# Patient Record
Sex: Female | Born: 1980 | Race: White | Hispanic: No | Marital: Married | Smoking: Never smoker
Health system: Southern US, Community
[De-identification: ages and names within clinical notes are randomized; demographics above are authoritative.]

## PROBLEM LIST (undated history)

## (undated) ENCOUNTER — Emergency Department (HOSPITAL_BASED_OUTPATIENT_CLINIC_OR_DEPARTMENT_OTHER): Disposition: A | Discharge status: Home / Self Care | Payer: PRIVATE HEALTH INSURANCE

## (undated) DIAGNOSIS — K219 Gastro-esophageal reflux disease without esophagitis: Secondary | ICD-10-CM

## (undated) DIAGNOSIS — E282 Polycystic ovarian syndrome: Secondary | ICD-10-CM

## (undated) DIAGNOSIS — F329 Major depressive disorder, single episode, unspecified: Secondary | ICD-10-CM

## (undated) DIAGNOSIS — M1990 Unspecified osteoarthritis, unspecified site: Secondary | ICD-10-CM

## (undated) DIAGNOSIS — F32A Depression, unspecified: Secondary | ICD-10-CM

## (undated) DIAGNOSIS — M797 Fibromyalgia: Secondary | ICD-10-CM

## (undated) DIAGNOSIS — G43909 Migraine, unspecified, not intractable, without status migrainosus: Secondary | ICD-10-CM

## (undated) DIAGNOSIS — Z8619 Personal history of other infectious and parasitic diseases: Secondary | ICD-10-CM

## (undated) HISTORY — DX: Unspecified osteoarthritis, unspecified site: M19.90

## (undated) HISTORY — DX: Major depressive disorder, single episode, unspecified: F32.9

## (undated) HISTORY — DX: Personal history of other infectious and parasitic diseases: Z86.19

## (undated) HISTORY — DX: Depression, unspecified: F32.A

## (undated) HISTORY — DX: Gastro-esophageal reflux disease without esophagitis: K21.9

## (undated) HISTORY — DX: Migraine, unspecified, not intractable, without status migrainosus: G43.909

---

## 2007-09-11 ENCOUNTER — Emergency Department (HOSPITAL_COMMUNITY): Admission: EM | Admit: 2007-09-11 | Discharge: 2007-09-11 | Payer: Self-pay | Admitting: Emergency Medicine

## 2010-03-25 HISTORY — PX: TUBAL LIGATION: SHX77

## 2010-03-25 LAB — HM PAP SMEAR: HM Pap smear: NORMAL

## 2012-07-15 ENCOUNTER — Encounter: Payer: Self-pay | Admitting: Family

## 2012-07-15 ENCOUNTER — Ambulatory Visit (INDEPENDENT_AMBULATORY_CARE_PROVIDER_SITE_OTHER): Payer: BC Managed Care – PPO | Admitting: Family

## 2012-07-15 VITALS — BP 118/80 | HR 90 | Temp 98.3°F | Resp 16 | Ht 64.75 in | Wt 178.1 lb

## 2012-07-15 DIAGNOSIS — K219 Gastro-esophageal reflux disease without esophagitis: Secondary | ICD-10-CM

## 2012-07-15 DIAGNOSIS — L680 Hirsutism: Secondary | ICD-10-CM

## 2012-07-15 DIAGNOSIS — F329 Major depressive disorder, single episode, unspecified: Secondary | ICD-10-CM

## 2012-07-15 DIAGNOSIS — N926 Irregular menstruation, unspecified: Secondary | ICD-10-CM

## 2012-07-15 DIAGNOSIS — G43909 Migraine, unspecified, not intractable, without status migrainosus: Secondary | ICD-10-CM

## 2012-07-15 DIAGNOSIS — M069 Rheumatoid arthritis, unspecified: Secondary | ICD-10-CM

## 2012-07-15 LAB — LUTEINIZING HORMONE: LH: 4.3 m[IU]/mL

## 2012-07-15 LAB — ESTRADIOL: Estradiol: 13.6 pg/mL

## 2012-07-15 LAB — FOLLICLE STIMULATING HORMONE: FSH: 2.2 m[IU]/mL

## 2012-07-15 NOTE — Patient Instructions (Addendum)
Please complete your lab work prior to leaving.  You will be contacted about your referral to rheumatology.  Please let us know if you have not heard back within 1 week about your referral. Follow up in 1 month for a complete fasting physical. Welcome to [facility]!

## 2012-07-15 NOTE — Progress Notes (Signed)
  Subjective:    Patient ID: Rayetta Humphrey, female    DOB: 1980-05-11, 32 y.o.   MRN: [id number]  HPI  Ms. Deprey is a 32 yr old female who presents today to establish care.  1) RA- Recently diagnosed with autoimmune rheumatoid arthritis (2011).  Has bilateral hip and knee pain. Previously diagnosed with fibromyalgia. She reports muscle soreness.  She reports that her knees constantly ache and crunch.    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 Reports that for the last 2 years she has had to shave daily.   3) GERD- she uses otc prilosec daily.  4) Depression- Diagnosed as a child.  7-17.  Spent 10 months in an inpatient facility.  She reports that since that time, she has done well except for post partum and she was treated x 6 months after each of her 3 pregnancies. Reports currently well controlled.   5) Frequent headaches- Reports 2-3 headaches a week, uses otc excedrin.  Some improvement. She reports that hte pain is over the right eye, radiates down into neck at times, throbbing. She reports + associated photophobia, but denies phonophobia.    Review of Systems  Constitutional: Negative for unexpected weight change.  HENT: Negative for hearing loss and congestion.   Eyes: Negative for visual disturbance.  Respiratory: Negative for cough and shortness of breath.   Cardiovascular: Negative for chest pain.  Gastrointestinal: Negative for nausea, vomiting and diarrhea.  Genitourinary: Negative for dysuria.       Irregular menses  Musculoskeletal: Positive for myalgias and arthralgias.  Skin: Positive for rash.       Reports psoriasis  Neurological: Positive for headaches.  Hematological: Negative for adenopathy.  Psychiatric/Behavioral:       Denies anxiety       Objective:   Physical Exam  Constitutional: She is oriented to person, place, and time. She appears well-developed and well-nourished. No distress.  HENT:  Head: Normocephalic and atraumatic.  Eyes: No  scleral icterus.  Neck: No thyromegaly present.  Cardiovascular: Normal rate and regular rhythm.   No murmur heard. Pulmonary/Chest: Effort normal and breath sounds normal. No respiratory distress. She has no wheezes. She has no rales. She exhibits no tenderness.  Lymphadenopathy:    She has no cervical adenopathy.  Neurological: She is alert and oriented to person, place, and time.  Skin: Skin is warm and dry.  Heavy facial hair noted on face and neck.  Psychiatric: She has a normal mood and affect. Her behavior is normal. Judgment and thought content normal.          Assessment & Plan:

## 2012-07-17 ENCOUNTER — Telehealth: Payer: Self-pay | Admitting: Family

## 2012-07-17 DIAGNOSIS — L680 Hirsutism: Secondary | ICD-10-CM

## 2012-07-17 NOTE — Telephone Encounter (Signed)
See mychart message. Will refer to endo for low estrogen increase T.

## 2012-07-18 ENCOUNTER — Telehealth: Payer: Self-pay | Admitting: Family

## 2012-07-18 DIAGNOSIS — F329 Major depressive disorder, single episode, unspecified: Secondary | ICD-10-CM | POA: Insufficient documentation

## 2012-07-18 DIAGNOSIS — L680 Hirsutism: Secondary | ICD-10-CM | POA: Insufficient documentation

## 2012-07-18 DIAGNOSIS — G43909 Migraine, unspecified, not intractable, without status migrainosus: Secondary | ICD-10-CM | POA: Insufficient documentation

## 2012-07-18 DIAGNOSIS — F32A Depression, unspecified: Secondary | ICD-10-CM | POA: Insufficient documentation

## 2012-07-18 DIAGNOSIS — M069 Rheumatoid arthritis, unspecified: Secondary | ICD-10-CM | POA: Insufficient documentation

## 2012-07-18 DIAGNOSIS — K219 Gastro-esophageal reflux disease without esophagitis: Secondary | ICD-10-CM | POA: Insufficient documentation

## 2012-07-18 MED ORDER — AMITRIPTYLINE HCL 25 MG PO TABS: 25.0000 mg | ORAL_TABLET | Freq: Every day | ORAL | Status: DC

## 2012-07-18 NOTE — Assessment & Plan Note (Signed)
Will give trial of Elavil for HA prophylaxis.

## 2012-07-18 NOTE — Assessment & Plan Note (Signed)
Stable on otc prilosec. Continue same.

## 2012-07-18 NOTE — Assessment & Plan Note (Signed)
She has never seen a rheumatologist for this. Will request old records. Refer to rheumatology for further evaluation.

## 2012-07-18 NOTE — Telephone Encounter (Signed)
Pls call pt and let her know that I have sent rx to [facility] for Elavil to take once daily to help prevent her headaches.

## 2012-07-18 NOTE — Assessment & Plan Note (Addendum)
Currently well controlled off of meds.  Monitor.

## 2012-07-18 NOTE — Assessment & Plan Note (Signed)
Lab work reveals elevated testosterone level and depressed estrogen level.  Will refer to Endo for further evaluation.

## 2012-07-21 NOTE — Telephone Encounter (Signed)
Notified pt. 

## 2012-08-05 ENCOUNTER — Encounter: Payer: BC Managed Care – PPO | Admitting: Family

## 2012-08-07 ENCOUNTER — Ambulatory Visit: Payer: BC Managed Care – PPO | Admitting: Internal Medicine

## 2012-08-27 ENCOUNTER — Ambulatory Visit: Payer: BC Managed Care – PPO | Admitting: Internal Medicine

## 2012-08-27 DIAGNOSIS — Z0289 Encounter for other administrative examinations: Secondary | ICD-10-CM

## 2012-09-01 ENCOUNTER — Ambulatory Visit (INDEPENDENT_AMBULATORY_CARE_PROVIDER_SITE_OTHER): Payer: BC Managed Care – PPO | Admitting: Family

## 2012-09-01 ENCOUNTER — Encounter: Payer: Self-pay | Admitting: Family

## 2012-09-01 VITALS — BP 116/60 | HR 78 | Temp 97.8°F | Resp 16 | Ht 64.75 in | Wt 178.0 lb

## 2012-09-01 DIAGNOSIS — Z0000 Encounter for general adult medical examination without abnormal findings: Secondary | ICD-10-CM

## 2012-09-01 LAB — URINALYSIS, ROUTINE W REFLEX MICROSCOPIC
Glucose, UA: NEGATIVE mg/dL
Hgb urine dipstick: NEGATIVE
Ketones, ur: NEGATIVE mg/dL
Leukocytes, UA: NEGATIVE
pH: 5.5 (ref 5.0–8.0)

## 2012-09-01 LAB — LIPID PANEL: Cholesterol: 184 mg/dL (ref 0–200)

## 2012-09-01 LAB — HEPATIC FUNCTION PANEL
AST: 14 U/L (ref 0–37)
Alkaline Phosphatase: 82 U/L (ref 39–117)
Bilirubin, Direct: 0.1 mg/dL (ref 0.0–0.3)
Total Bilirubin: 0.3 mg/dL (ref 0.3–1.2)

## 2012-09-01 LAB — CBC WITH DIFFERENTIAL/PLATELET
Basophils Relative: 0 % (ref 0–1)
Eosinophils Absolute: 0 10*3/uL (ref 0.0–0.7)
MCH: 30.3 pg (ref 26.0–34.0)
MCHC: 33.8 g/dL (ref 30.0–36.0)
Neutrophils Relative %: 61 % (ref 43–77)
Platelets: 319 10*3/uL (ref 150–400)
RBC: 4.19 MIL/uL (ref 3.87–5.11)

## 2012-09-01 NOTE — Patient Instructions (Addendum)
Please complete lab work prior to leaving. Schedule pap smear at your earliest convenience.

## 2012-09-01 NOTE — Assessment & Plan Note (Signed)
Discussed, importance of healthy diet, exercise. Obtained fasting labs.  She will return another day for pap.

## 2012-09-01 NOTE — Progress Notes (Signed)
Subjective:    Patient ID: Brandi Parrish, female    DOB: 09-Apr-1980, 32 y.o.   MRN: [id number]  HPI  Brandi Parrish is a 32 yr old female who presents today for cpx.  Pt here for fasting physical.  Up to date with tetanus, last pap smear 2012.  Patient presents today for complete physical.  Immunizations: tetanus up to date Diet: could eat less fried foods.  Exercise: walking and swimming Pap Smear: due- she wishes to defer  Hirsuitism- last visit estrogen was depressed and testosterone elevated.  Referral made to endo. She has rescheduled her appointment with endo.  RA- has upcoming apt with rheumatology.  Migraines- Last visit trial of amitriptyline was attempted. Reports that her headaches have improved. Only one Migraine since starting.     Review of Systems  Constitutional: Negative for unexpected weight change.  HENT: Negative for congestion.   Eyes: Negative for visual disturbance.  Respiratory: Negative for cough.   Cardiovascular: Negative for chest pain.  Gastrointestinal:       Mild nausea about 2 days a week in early AM with diarrhea.  This has been going on x 1 month.   Genitourinary:       Irregular menses  Musculoskeletal:       Chronic knee/hip pain  Skin: Negative for rash.  Neurological: Positive for headaches.  Hematological: Negative for adenopathy.  Psychiatric/Behavioral:       Reports that depression is stable.    Past Medical History  Diagnosis Date  . Arthritis     autoimmune rheumatoid arthritis  . History of chicken pox   . Depression   . GERD (gastroesophageal reflux disease)     History   Social History  . Marital Status: Married    Spouse Name: N/A    Number of Children: 3  . Years of Education: N/A   Occupational History  .     Social History Main Topics  . Smoking status: Never Smoker   . Smokeless tobacco: Never Used  . Alcohol Use: Yes     Comment: 1-2 drinks per month  . Drug Use: Not on file  . Sexually Active: Not  on file   Other Topics Concern  . 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 Cesarean section  2009  . Cesarean section  2012  . Tubal ligation  2012    Family History  Problem Relation Age of Onset  . Arthritis Mother     spondylitis  . Heart disease Father   . Diabetes Maternal Uncle   . Arthritis Maternal Grandmother   . Arthritis Maternal Grandfather   . Arthritis Paternal Grandmother     RA  . Arthritis Paternal Grandfather   . Hyperlipidemia Paternal Grandfather   . Hypertension Paternal Grandfather   . Stroke Paternal Grandfather   . Kidney disease Paternal Grandfather   . Heart disease Paternal Grandfather     No Known Allergies  Current Outpatient Prescriptions on File Prior to Visit  Medication Sig Dispense Refill  . amitriptyline (ELAVIL) 25 MG tablet Take 1 tablet (25 mg total) by mouth at bedtime.  30 tablet  0  . omeprazole (PRILOSEC) 20 MG capsule Take 40 mg by mouth daily.  No current facility-administered medications on file prior to visit.    BP 116/60  Pulse 78  Temp(Src) 97.8 F (36.6 C) (Oral)  Resp 16  Ht 5' 4.75" (1.645 m)  Wt 178 lb (80.74 kg)  BMI 29.84 kg/m2  SpO2 99%  LMP 08/12/2012       Objective:   Physical Exam  Physical Exam  Constitutional: She is oriented to person, place, and time. She appears well-developed and well-nourished. No distress.  HENT:  Head: Normocephalic and atraumatic.  Right Ear: Tympanic membrane and ear canal normal.  Left Ear: Tympanic membrane and ear canal normal.  Mouth/Throat: Oropharynx is clear and moist.  Eyes: Pupils are equal, round, and reactive to light. No scleral icterus.  Neck: Normal range of motion. No thyromegaly present.  Cardiovascular: Normal rate and regular rhythm.   No  murmur heard. Pulmonary/Chest: Effort normal and breath sounds normal. No respiratory distress. He has no wheezes. She has no rales. She exhibits no tenderness.  Abdominal: Soft. Bowel sounds are normal. He exhibits no distension and no mass. There is no tenderness. There is no rebound and no guarding.  Musculoskeletal: She exhibits no edema.  Lymphadenopathy:    She has no cervical adenopathy.  Neurological: She is alert and oriented to person, place, and time. She exhibits normal muscle tone. Coordination normal.  Skin: Skin is warm and dry. Hirsuitism Psychiatric: She has a normal mood and affect. Her behavior is normal. Judgment and thought content normal.    Assessment & Plan:         Assessment & Plan:

## 2012-09-02 ENCOUNTER — Encounter: Payer: Self-pay | Admitting: Family

## 2012-09-02 LAB — BASIC METABOLIC PANEL WITH GFR
BUN: 8 mg/dL (ref 6–23)
Chloride: 104 mEq/L (ref 96–112)
GFR, Est African American: >89 mL/min
GFR, Est Non African American: >89 mL/min
Potassium: 4.8 mEq/L (ref 3.5–5.3)

## 2012-09-07 ENCOUNTER — Encounter (HOSPITAL_BASED_OUTPATIENT_CLINIC_OR_DEPARTMENT_OTHER): Payer: Self-pay | Admitting: *Deleted

## 2012-09-07 ENCOUNTER — Emergency Department (HOSPITAL_BASED_OUTPATIENT_CLINIC_OR_DEPARTMENT_OTHER)
Admission: EM | Admit: 2012-09-07 | Discharge: 2012-09-07 | Disposition: A | Discharge status: Home / Self Care | Payer: BC Managed Care – PPO | Attending: Emergency Medicine | Admitting: Emergency Medicine

## 2012-09-07 DIAGNOSIS — Z79899 Other long term (current) drug therapy: Secondary | ICD-10-CM | POA: Insufficient documentation

## 2012-09-07 DIAGNOSIS — F329 Major depressive disorder, single episode, unspecified: Secondary | ICD-10-CM | POA: Insufficient documentation

## 2012-09-07 DIAGNOSIS — K219 Gastro-esophageal reflux disease without esophagitis: Secondary | ICD-10-CM | POA: Insufficient documentation

## 2012-09-07 DIAGNOSIS — M25569 Pain in unspecified knee: Secondary | ICD-10-CM | POA: Insufficient documentation

## 2012-09-07 DIAGNOSIS — M549 Dorsalgia, unspecified: Secondary | ICD-10-CM

## 2012-09-07 DIAGNOSIS — F3289 Other specified depressive episodes: Secondary | ICD-10-CM | POA: Insufficient documentation

## 2012-09-07 DIAGNOSIS — M545 Low back pain, unspecified: Secondary | ICD-10-CM | POA: Insufficient documentation

## 2012-09-07 DIAGNOSIS — Z8619 Personal history of other infectious and parasitic diseases: Secondary | ICD-10-CM | POA: Insufficient documentation

## 2012-09-07 DIAGNOSIS — M25559 Pain in unspecified hip: Secondary | ICD-10-CM | POA: Insufficient documentation

## 2012-09-07 DIAGNOSIS — Z8739 Personal history of other diseases of the musculoskeletal system and connective tissue: Secondary | ICD-10-CM | POA: Insufficient documentation

## 2012-09-07 DIAGNOSIS — Z3202 Encounter for pregnancy test, result negative: Secondary | ICD-10-CM | POA: Insufficient documentation

## 2012-09-07 LAB — URINALYSIS, ROUTINE W REFLEX MICROSCOPIC
Bilirubin Urine: NEGATIVE
Glucose, UA: NEGATIVE mg/dL
Hgb urine dipstick: NEGATIVE
Ketones, ur: NEGATIVE mg/dL
Leukocytes, UA: NEGATIVE
Nitrite: NEGATIVE
Protein, ur: NEGATIVE mg/dL
Specific Gravity, Urine: 1.025 (ref 1.005–1.030)
Urobilinogen, UA: 1 mg/dL (ref 0.0–1.0)
pH: 6 (ref 5.0–8.0)

## 2012-09-07 LAB — PREGNANCY, URINE: Preg Test, Ur: NEGATIVE

## 2012-09-07 MED ORDER — PREDNISONE 20 MG PO TABS: 40.0000 mg | ORAL_TABLET | Freq: Every day | ORAL | Status: DC

## 2012-09-07 MED ORDER — HYDROMORPHONE HCL PF 1 MG/ML IJ SOLN
1.0000 mg | Freq: Once | INTRAMUSCULAR | Status: AC
  Administered 2012-09-07: 1 mg via INTRAMUSCULAR
  Filled 2012-09-07: qty 1

## 2012-09-07 MED ORDER — DIPHENHYDRAMINE HCL 25 MG PO CAPS
25.0000 mg | ORAL_CAPSULE | Freq: Once | ORAL | Status: DC
  Filled 2012-09-07: qty 1

## 2012-09-07 MED ORDER — OXYCODONE-ACETAMINOPHEN 5-325 MG PO TABS: 1.0000 | ORAL_TABLET | ORAL | Status: DC | PRN

## 2012-09-07 NOTE — ED Provider Notes (Signed)
Medical screening examination/treatment/procedure(s) were performed by non-physician practitioner and as supervising physician I was immediately available for consultation/collaboration.    Adrie Picking, MD 09/07/12 2343 

## 2012-09-07 NOTE — ED Notes (Signed)
Called in to pt's room-c/o face and upper back itching-no hives/redness noted-no SOB noted-EDPA notified

## 2012-09-07 NOTE — ED Notes (Signed)
Pt c/o right lower back pain radiates down right hip and leg x 8 hrs denies injury

## 2012-09-07 NOTE — ED Provider Notes (Signed)
History     CSN: [id number]  Arrival date & time 09/07/12  1819   First MD Initiated Contact with Patient 09/07/12 1824      Chief Complaint  Patient presents with  . Back Pain    (Consider location/radiation/quality/duration/timing/severity/associated sxs/prior treatment) HPI Patient presents to the ED for back pain. States pain began approximately 8 hours and is now radiating down her right hip and posterior thigh.  No alleviating or aggravating factors.  Patient denies any recent injury, trauma, or fall. Patient states she routinely with her children, but this is not anything that she does not usually do on a daily basis. No excessive lifting or strenuous activity to cause muscle strain. Denies any numbness or paresthesias of lower extremities.  Patient has a history of RA and fibromyalgia.  Denies any urinary symptoms. Patient has not taken any medications for her symptoms.    Past Medical History  Diagnosis Date  . Arthritis     autoimmune rheumatoid arthritis  . History of chicken pox   . Depression   . GERD (gastroesophageal reflux disease)     Past Surgical History  Procedure Laterality Date  . Cesarean section  2009  . Cesarean section  2012  . Tubal ligation  2012    Family History  Problem Relation Age of Onset  . Arthritis Mother     spondylitis  . Heart disease Father   . Diabetes Maternal Uncle   . Arthritis Maternal Grandmother   . Arthritis Maternal Grandfather   . Arthritis Paternal Grandmother     RA  . Arthritis Paternal Grandfather   . Hyperlipidemia Paternal Grandfather   . Hypertension Paternal Grandfather   . Stroke Paternal Grandfather   . Kidney disease Paternal Grandfather   . Heart disease Paternal Grandfather     History  Substance Use Topics  . Smoking status: Never Smoker   . Smokeless tobacco: Never Used  . Alcohol Use: Yes     Comment: 1-2 drinks per month    OB History   Grav Para Term Preterm Abortions TAB SAB Ect Mult  Living                  Review of Systems  Musculoskeletal: Positive for back pain.  All other systems reviewed and are negative.    Allergies  Review of patient's allergies indicates no known allergies.  Home Medications   Current Outpatient Rx  Name  Route  Sig  Dispense  Refill  . amitriptyline (ELAVIL) 25 MG tablet   Oral   Take 1 tablet (25 mg total) by mouth at bedtime.   30 tablet   0   . omeprazole (PRILOSEC) 20 MG capsule   Oral   Take 40 mg by mouth daily.           BP 121/75  Pulse 110  Temp(Src) 98.5 F (36.9 C)  Ht 5\' 4"  (1.626 m)  Wt 178 lb (80.74 kg)  BMI 30.54 kg/m2  LMP 08/12/2012  Physical Exam  Nursing note and vitals reviewed. Constitutional: She is oriented to person, place, and time. She appears well-developed and well-nourished.  HENT:  Head: Normocephalic and atraumatic.  Eyes: Conjunctivae and EOM are normal.  Neck: Normal range of motion.  Cardiovascular: Normal rate, regular rhythm and normal heart sounds.   Pulmonary/Chest: Effort normal and breath sounds normal. No respiratory distress.  Musculoskeletal: Normal range of motion. She exhibits no edema.       Lumbar back: She exhibits tenderness  and pain. She exhibits normal range of motion, no bony tenderness, no swelling, no edema, no deformity, no laceration, no spasm and normal pulse.       Back:  TTP of right LS, no midline tenderness or spasm present, strong distal pulse, sensation intact  Neurological: She is alert and oriented to person, place, and time.  Skin: Skin is warm and dry.  Psychiatric: She has a normal mood and affect.    ED Course  Procedures (including critical care time)  Labs Reviewed  URINALYSIS, ROUTINE W REFLEX MICROSCOPIC  PREGNANCY, URINE   No results found.   1. Back pain       MDM   u-preg negative.  U/a free of infection. No recent injury or trauma- imaging deferred.  No loss of bowel/bladder function, saddle or extremity  numbness/paresthesias- doubt cauda equina, SCI, or other nerve damage.  Likely RA flare + fibromyalgia.  Rx percocet and steroid taper.  FU with PCP if sx not improving.  Discussed plan with pt, she agreed.      Garlon Hatchet, PA-C 09/07/12 2233

## 2012-09-07 NOTE — ED Notes (Signed)
In to give benadryl-pt now denies itching and need for benadryl

## 2013-01-28 ENCOUNTER — Other Ambulatory Visit: Payer: Self-pay

## 2013-03-01 ENCOUNTER — Ambulatory Visit: Payer: BC Managed Care – PPO | Admitting: Family

## 2013-03-08 ENCOUNTER — Encounter: Payer: Self-pay | Admitting: Family

## 2013-03-08 ENCOUNTER — Ambulatory Visit (HOSPITAL_BASED_OUTPATIENT_CLINIC_OR_DEPARTMENT_OTHER)
Admission: RE | Admit: 2013-03-08 | Discharge: 2013-03-08 | Disposition: A | Discharge status: Home / Self Care | Payer: PRIVATE HEALTH INSURANCE | Source: Ambulatory Visit | Attending: Family | Admitting: Family

## 2013-03-08 ENCOUNTER — Ambulatory Visit (INDEPENDENT_AMBULATORY_CARE_PROVIDER_SITE_OTHER): Payer: PRIVATE HEALTH INSURANCE | Admitting: Family

## 2013-03-08 VITALS — BP 122/88 | HR 70 | Temp 97.8°F | Resp 16 | Ht 64.75 in | Wt 175.1 lb

## 2013-03-08 DIAGNOSIS — M549 Dorsalgia, unspecified: Secondary | ICD-10-CM

## 2013-03-08 DIAGNOSIS — M545 Low back pain, unspecified: Secondary | ICD-10-CM | POA: Insufficient documentation

## 2013-03-08 DIAGNOSIS — Z9851 Tubal ligation status: Secondary | ICD-10-CM | POA: Insufficient documentation

## 2013-03-08 DIAGNOSIS — R209 Unspecified disturbances of skin sensation: Secondary | ICD-10-CM

## 2013-03-08 DIAGNOSIS — L408 Other psoriasis: Secondary | ICD-10-CM

## 2013-03-08 DIAGNOSIS — L409 Psoriasis, unspecified: Secondary | ICD-10-CM | POA: Insufficient documentation

## 2013-03-08 DIAGNOSIS — IMO0002 Reserved for concepts with insufficient information to code with codable children: Secondary | ICD-10-CM

## 2013-03-08 DIAGNOSIS — M5410 Radiculopathy, site unspecified: Secondary | ICD-10-CM | POA: Insufficient documentation

## 2013-03-08 DIAGNOSIS — R202 Paresthesia of skin: Secondary | ICD-10-CM

## 2013-03-08 LAB — FOLATE: Folate: 13.9 ng/mL

## 2013-03-08 MED ORDER — FLUOCINOLONE ACETONIDE SCALP 0.01 % EX OIL: 1.0000 "application " | TOPICAL_OIL | Freq: Two times a day (BID) | CUTANEOUS | Status: DC

## 2013-03-08 MED ORDER — CYCLOBENZAPRINE HCL 5 MG PO TABS: 5.0000 mg | ORAL_TABLET | Freq: Three times a day (TID) | ORAL | Status: DC | PRN

## 2013-03-08 MED ORDER — METHYLPREDNISOLONE 4 MG PO KIT: PACK | ORAL | Status: DC

## 2013-03-08 MED ORDER — AMITRIPTYLINE HCL 25 MG PO TABS: 25.0000 mg | ORAL_TABLET | Freq: Every day | ORAL | Status: DC

## 2013-03-08 NOTE — Progress Notes (Signed)
Pre visit review using our clinic review tool, if applicable. No additional management support is needed unless otherwise documented below in the visit note. 

## 2013-03-08 NOTE — Assessment & Plan Note (Signed)
rx with fluocinolone oil.

## 2013-03-08 NOTE — Assessment & Plan Note (Addendum)
Will rx with medrol dose pak and prn flexeril (she is advised that flexeril may cause drowsiness and not to drive after taking) Obtain X ray of the lumbar spine. Will send B12 and Folate due to c/o numbness to rule out deficiency.

## 2013-03-08 NOTE — Patient Instructions (Signed)
Complete x ray on the first floor. Please call if symptoms worsen or if not resolved in 1 week. Complete lab work prior to leaving.

## 2013-03-08 NOTE — Progress Notes (Signed)
Subjective:    Patient ID: Brandi Parrish, female    DOB: Aug 14, 1980, 32 y.o.   MRN: [id number]  HPI  Brandi Parrish is a 32 yr old female who presents today with chief complaint of leg pain.   Leg pain- reports it happens several times a month. Feel like people are "Rubbing  Sandpaper." on the back of her legs.  Has cramping/electric pain down the back of the legs- mostly radiates down the back of the right leg.   Burning pain in her feet. Started 4-5 days ago. Helps pain to remove pants.    Psoriasis- notes flaking/itching scalp. No improvement with otc dermarest.       Review of Systems See HPI  Past Medical History  Diagnosis Date  . Arthritis     autoimmune rheumatoid arthritis  . History of chicken pox   . Depression   . GERD (gastroesophageal reflux disease)     History   Social History  . Marital Status: Married    Spouse Name: N/A    Number of Children: 3  . Years of Education: N/A   Occupational History  .     Social History Main Topics  . Smoking status: Never Smoker   . Smokeless tobacco: Never Used  . Alcohol Use: Yes     Comment: 1-2 drinks per month  . Drug Use: Not on file  . Sexual Activity: Yes    Birth Control/ Protection: Surgical   Other Topics Concern  . 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 Cesarean section  2009  . Cesarean section  2012  . Tubal ligation  2012    Family History  Problem Relation Age of Onset  . Arthritis Mother     spondylitis  . Heart disease Father   . Diabetes Maternal Uncle   . Arthritis Maternal Grandmother   . Arthritis Maternal Grandfather   . Arthritis Paternal Grandmother     RA  . Arthritis Paternal Grandfather   . Hyperlipidemia Paternal Grandfather   . Hypertension Paternal Grandfather   .  Stroke Paternal Grandfather   . Kidney disease Paternal Grandfather   . Heart disease Paternal Grandfather     No Known Allergies  Current Outpatient Prescriptions on File Prior to Visit  Medication Sig Dispense Refill  . omeprazole (PRILOSEC) 20 MG capsule Take 40 mg by mouth daily.       No current facility-administered medications on file prior to visit.    BP 122/88  Pulse 70  Temp(Src) 97.8 F (36.6 C) (Oral)  Resp 16  Ht 5' 4.75" (1.645 m)  Wt 175 lb 1.9 oz (79.434 kg)  BMI 29.35 kg/m2  SpO2 98%  LMP 02/25/2013        Objective:   Physical Exam  Constitutional: She is oriented to person, place, and time. She appears well-developed and well-nourished.  Uncomfortable appearing female seated on table.   HENT:  Head: Normocephalic and atraumatic.  Cardiovascular: Normal rate and regular rhythm.   No murmur heard. Pulmonary/Chest: Effort normal and breath sounds normal. No respiratory distress. She has no wheezes. She has no rales.  Musculoskeletal: She exhibits no edema.       Cervical back: She exhibits  no tenderness.       Thoracic back: She exhibits no tenderness.       Lumbar back: She exhibits tenderness.  Bilateral LE strength is 5/5  Neurological: She is alert and oriented to person, place, and time.  Reflex Scores:      Patellar reflexes are 2+ on the right side and 2+ on the left side. Skin: Skin is warm and dry.  Erythematous patch   Psychiatric: She has a normal mood and affect. Her behavior is normal. Judgment and thought content normal.          Assessment & Plan:

## 2013-04-20 ENCOUNTER — Encounter: Payer: Self-pay | Admitting: Family

## 2013-04-20 DIAGNOSIS — L409 Psoriasis, unspecified: Secondary | ICD-10-CM

## 2013-04-21 MED ORDER — PANTOPRAZOLE SODIUM 40 MG PO TBEC: 40.0000 mg | DELAYED_RELEASE_TABLET | Freq: Every day | ORAL | Status: DC

## 2013-04-22 NOTE — Addendum Note (Signed)
Addended by: Sandford Craze on: 04/22/2013 06:35 AM   Modules accepted: Orders

## 2013-12-11 ENCOUNTER — Encounter (HOSPITAL_BASED_OUTPATIENT_CLINIC_OR_DEPARTMENT_OTHER): Payer: Self-pay | Admitting: Emergency Medicine

## 2013-12-11 ENCOUNTER — Emergency Department (HOSPITAL_BASED_OUTPATIENT_CLINIC_OR_DEPARTMENT_OTHER)
Admission: EM | Admit: 2013-12-11 | Discharge: 2013-12-11 | Disposition: A | Discharge status: Home / Self Care | Payer: PRIVATE HEALTH INSURANCE | Attending: Emergency Medicine | Admitting: Emergency Medicine

## 2013-12-11 DIAGNOSIS — F3289 Other specified depressive episodes: Secondary | ICD-10-CM | POA: Insufficient documentation

## 2013-12-11 DIAGNOSIS — IMO0002 Reserved for concepts with insufficient information to code with codable children: Secondary | ICD-10-CM | POA: Insufficient documentation

## 2013-12-11 DIAGNOSIS — Z79899 Other long term (current) drug therapy: Secondary | ICD-10-CM | POA: Insufficient documentation

## 2013-12-11 DIAGNOSIS — K219 Gastro-esophageal reflux disease without esophagitis: Secondary | ICD-10-CM | POA: Insufficient documentation

## 2013-12-11 DIAGNOSIS — Z8619 Personal history of other infectious and parasitic diseases: Secondary | ICD-10-CM | POA: Insufficient documentation

## 2013-12-11 DIAGNOSIS — R197 Diarrhea, unspecified: Secondary | ICD-10-CM | POA: Insufficient documentation

## 2013-12-11 DIAGNOSIS — R112 Nausea with vomiting, unspecified: Secondary | ICD-10-CM | POA: Insufficient documentation

## 2013-12-11 DIAGNOSIS — F329 Major depressive disorder, single episode, unspecified: Secondary | ICD-10-CM | POA: Insufficient documentation

## 2013-12-11 LAB — CBC WITH DIFFERENTIAL/PLATELET
BASOS PCT: 0 % (ref 0–1)
Basophils Absolute: 0 10*3/uL (ref 0.0–0.1)
EOS ABS: 0 10*3/uL (ref 0.0–0.7)
EOS PCT: 0 % (ref 0–5)
HCT: 41.4 % (ref 36.0–46.0)
Hemoglobin: 14.1 g/dL (ref 12.0–15.0)
LYMPHS ABS: 0.8 10*3/uL (ref 0.7–4.0)
Lymphocytes Relative: 6 % — ABNORMAL LOW (ref 12–46)
MCH: 31.7 pg (ref 26.0–34.0)
MCHC: 34.1 g/dL (ref 30.0–36.0)
MCV: 93 fL (ref 78.0–100.0)
MONOS PCT: 7 % (ref 3–12)
Monocytes Absolute: 1 10*3/uL (ref 0.1–1.0)
NEUTROS PCT: 87 % — AB (ref 43–77)
Neutro Abs: 13.5 10*3/uL — ABNORMAL HIGH (ref 1.7–7.7)
PLATELETS: 300 10*3/uL (ref 150–400)
RBC: 4.45 MIL/uL (ref 3.87–5.11)
RDW: 13.6 % (ref 11.5–15.5)
WBC: 15.4 10*3/uL — ABNORMAL HIGH (ref 4.0–10.5)

## 2013-12-11 LAB — COMPREHENSIVE METABOLIC PANEL
ALBUMIN: 4.4 g/dL (ref 3.5–5.2)
ALT: 21 U/L (ref 0–35)
ANION GAP: 16 — AB (ref 5–15)
AST: 25 U/L (ref 0–37)
Alkaline Phosphatase: 81 U/L (ref 39–117)
BUN: 12 mg/dL (ref 6–23)
CALCIUM: 9.7 mg/dL (ref 8.4–10.5)
CO2: 22 mEq/L (ref 19–32)
Chloride: 100 mEq/L (ref 96–112)
Creatinine, Ser: 0.9 mg/dL (ref 0.50–1.10)
GFR calc non Af Amer: 83 mL/min — ABNORMAL LOW (ref >90)
GLUCOSE: 114 mg/dL — AB (ref 70–99)
Potassium: 4.2 mEq/L (ref 3.7–5.3)
Sodium: 138 mEq/L (ref 137–147)
TOTAL PROTEIN: 8.4 g/dL — AB (ref 6.0–8.3)
Total Bilirubin: 0.3 mg/dL (ref 0.3–1.2)

## 2013-12-11 MED ORDER — MORPHINE SULFATE 4 MG/ML IJ SOLN
4.0000 mg | Freq: Once | INTRAMUSCULAR | Status: AC
  Administered 2013-12-11: 4 mg via INTRAVENOUS
  Filled 2013-12-11: qty 1

## 2013-12-11 MED ORDER — ONDANSETRON HCL 4 MG/2ML IJ SOLN
4.0000 mg | Freq: Once | INTRAMUSCULAR | Status: AC
  Administered 2013-12-11: 4 mg via INTRAVENOUS
  Filled 2013-12-11: qty 2

## 2013-12-11 MED ORDER — SODIUM CHLORIDE 0.9 % IV BOLUS (SEPSIS)
1000.0000 mL | Freq: Once | INTRAVENOUS | Status: AC
  Administered 2013-12-11: 1000 mL via INTRAVENOUS

## 2013-12-11 MED ORDER — DICYCLOMINE HCL 20 MG PO TABS: 20.0000 mg | ORAL_TABLET | Freq: Two times a day (BID) | ORAL | Status: DC

## 2013-12-11 MED ORDER — ONDANSETRON HCL 4 MG PO TABS: 4.0000 mg | ORAL_TABLET | Freq: Four times a day (QID) | ORAL | Status: DC

## 2013-12-11 NOTE — Discharge Instructions (Signed)

## 2013-12-11 NOTE — ED Notes (Signed)
Pt reports n/v/d since 1300, with generalized weakness and feeling lightheaded, reports upper mid abdominal pain.

## 2013-12-17 NOTE — ED Provider Notes (Signed)
Medical screening examination/treatment/procedure(s) were conducted as a shared visit with non-physician practitioner(s) and myself.  I personally evaluated the patient during the encounter.  Toy Cookey, MD 12/17/13 2036

## 2013-12-17 NOTE — ED Provider Notes (Signed)
CSN: [id number]     Arrival date & time 12/11/13  1804 History   First MD Initiated Contact with Patient 12/11/13 1948     Chief Complaint  Patient presents with  . Emesis     (Consider location/radiation/quality/duration/timing/severity/associated sxs/prior Treatment) Patient is a 33 y.o. female presenting with vomiting. The history is provided by the patient. No language interpreter was used.  Emesis Severity:  Moderate Duration:  5 hours Chronicity:  New Associated symptoms: no chills   Associated symptoms comment:  Nausea, vomiting, with infrequent diarrhea that started earlier today. No fever. She has mild upper abdominal discomfort. Emesis and stools have been non-bloody. No dysuria, cough, SOB.   Past Medical History  Diagnosis Date  . Arthritis     autoimmune rheumatoid arthritis  . History of chicken pox   . Depression   . GERD (gastroesophageal reflux disease)    Past Surgical History  Procedure Laterality Date  . Cesarean section  2009  . Cesarean section  2012  . Tubal ligation  2012   Family History  Problem Relation Age of Onset  . Arthritis Mother     spondylitis  . Heart disease Father   . Diabetes Maternal Uncle   . Arthritis Maternal Grandmother   . Arthritis Maternal Grandfather   . Arthritis Paternal Grandmother     RA  . Arthritis Paternal Grandfather   . Hyperlipidemia Paternal Grandfather   . Hypertension Paternal Grandfather   . Stroke Paternal Grandfather   . Kidney disease Paternal Grandfather   . Heart disease Paternal Grandfather    History  Substance Use Topics  . Smoking status: Never Smoker   . Smokeless tobacco: Never Used  . Alcohol Use: Yes     Comment: 1-2 drinks per month   OB History   Grav Para Term Preterm Abortions TAB SAB Ect Mult Living                 Review of Systems  Constitutional: Negative for fever and chills.  HENT: Negative.   Respiratory: Negative.   Cardiovascular: Negative.   Gastrointestinal:  Positive for vomiting.  Genitourinary: Negative.   Musculoskeletal: Negative.   Skin: Negative.   Neurological: Negative.       Allergies  Review of patient's allergies indicates no known allergies.  Home Medications   Prior to Admission medications   Medication Sig Start Date End Date Taking? Authorizing Provider  omeprazole (PRILOSEC) 40 MG capsule Take 40 mg by mouth daily.   Yes Historical Provider, MD  amitriptyline (ELAVIL) 25 MG tablet Take 1 tablet (25 mg total) by mouth at bedtime. 03/08/13   Sandford Craze, NP  cyclobenzaprine (FLEXERIL) 5 MG tablet Take 1 tablet (5 mg total) by mouth 3 (three) times daily as needed for muscle spasms. 03/08/13   Sandford Craze, NP  dicyclomine (BENTYL) 20 MG tablet Take 1 tablet (20 mg total) by mouth 2 (two) times daily. 12/11/13   Karys Meckley A Ipek Westra, PA-C  FLUOCINOLONE ACETONIDE SCALP 0.01 % OIL Apply 1 application topically 2 (two) times daily. 03/08/13   Sandford Craze, NP  methylPREDNISolone (MEDROL DOSEPAK) 4 MG tablet follow package directions 03/08/13   Sandford Craze, NP  ondansetron (ZOFRAN) 4 MG tablet Take 1 tablet (4 mg total) by mouth every 6 (six) hours. 12/11/13   Sesilia Poucher A Skylen Danielsen, PA-C  pantoprazole (PROTONIX) 40 MG tablet Take 1 tablet (40 mg total) by mouth daily. 04/21/13   Sandford Craze, NP   BP 107/68  Pulse 98  Temp(Src)  98.1 F (36.7 C) (Oral)  Resp 18  Ht 5\' 3"  (1.6 m)  Wt 180 lb (81.647 kg)  BMI 31.89 kg/m2  SpO2 99%  LMP 12/04/2013 Physical Exam  Constitutional: She appears well-developed and well-nourished.  HENT:  Head: Normocephalic.  Neck: Normal range of motion. Neck supple.  Cardiovascular: Normal rate and regular rhythm.   Pulmonary/Chest: Effort normal and breath sounds normal.  Abdominal: Soft. Bowel sounds are normal. There is no tenderness. There is no rebound and no guarding.  Musculoskeletal: Normal range of motion.  Neurological: She is alert. No cranial nerve deficit.   Skin: Skin is warm and dry. No rash noted.  Psychiatric: She has a normal mood and affect.    ED Course  Procedures (including critical care time) Labs Review Labs Reviewed  CBC WITH DIFFERENTIAL - Abnormal; Notable for the following:    WBC 15.4 (*)    Neutrophils Relative % 87 (*)    Neutro Abs 13.5 (*)    Lymphocytes Relative 6 (*)    All other components within normal limits  COMPREHENSIVE METABOLIC PANEL - Abnormal; Notable for the following:    Glucose, Bld 114 (*)    Total Protein 8.4 (*)    GFR calc non Af Amer 83 (*)    Anion gap 16 (*)    All other components within normal limits    Imaging Review No results found.   EKG Interpretation None      MDM   Final diagnoses:  Nausea vomiting and diarrhea    No tenderness specific to RUQ and normal LFT's - doubt cholecystitis. Symptoms likely viral. She feels better after IV fluids, VS improved. No further vomiting or diarrhea in ED. She is comfortable with discharge home.     02/03/2014, PA-C 12/17/13 [phone]

## 2014-02-18 ENCOUNTER — Emergency Department (HOSPITAL_BASED_OUTPATIENT_CLINIC_OR_DEPARTMENT_OTHER)
Admission: EM | Admit: 2014-02-18 | Discharge: 2014-02-18 | Disposition: A | Discharge status: Home / Self Care | Payer: PRIVATE HEALTH INSURANCE | Attending: Emergency Medicine | Admitting: Emergency Medicine

## 2014-02-18 ENCOUNTER — Emergency Department (HOSPITAL_BASED_OUTPATIENT_CLINIC_OR_DEPARTMENT_OTHER): Payer: PRIVATE HEALTH INSURANCE

## 2014-02-18 ENCOUNTER — Encounter (HOSPITAL_BASED_OUTPATIENT_CLINIC_OR_DEPARTMENT_OTHER): Payer: Self-pay | Admitting: General Practice

## 2014-02-18 DIAGNOSIS — F329 Major depressive disorder, single episode, unspecified: Secondary | ICD-10-CM | POA: Insufficient documentation

## 2014-02-18 DIAGNOSIS — K219 Gastro-esophageal reflux disease without esophagitis: Secondary | ICD-10-CM | POA: Insufficient documentation

## 2014-02-18 DIAGNOSIS — Z79899 Other long term (current) drug therapy: Secondary | ICD-10-CM | POA: Insufficient documentation

## 2014-02-18 DIAGNOSIS — M797 Fibromyalgia: Secondary | ICD-10-CM | POA: Insufficient documentation

## 2014-02-18 DIAGNOSIS — R059 Cough, unspecified: Secondary | ICD-10-CM

## 2014-02-18 DIAGNOSIS — Z8739 Personal history of other diseases of the musculoskeletal system and connective tissue: Secondary | ICD-10-CM | POA: Insufficient documentation

## 2014-02-18 DIAGNOSIS — R05 Cough: Secondary | ICD-10-CM | POA: Insufficient documentation

## 2014-02-18 DIAGNOSIS — R0602 Shortness of breath: Secondary | ICD-10-CM | POA: Insufficient documentation

## 2014-02-18 HISTORY — DX: Fibromyalgia: M79.7

## 2014-02-18 MED ORDER — ALBUTEROL SULFATE HFA 108 (90 BASE) MCG/ACT IN AERS
2.0000 | INHALATION_SPRAY | RESPIRATORY_TRACT | Status: DC | PRN
  Administered 2014-02-18: 2 via RESPIRATORY_TRACT
  Filled 2014-02-18: qty 6.7

## 2014-02-18 NOTE — ED Notes (Signed)
Pt c/o of cough, SOB, light headed x 1 week. Denies fever. No medications today. Taking Delsym cough medicine this past week and cough drops.

## 2014-02-18 NOTE — ED Provider Notes (Signed)
CSN: [id number]     Arrival date & time 02/18/14  0940 History   First MD Initiated Contact with Patient 02/18/14 1026     Chief Complaint  Patient presents with  . Cough  . Shortness of Breath      HPI        Pt c/o of cough, SOB, light headed x 1 week. Denies fever. No medications today. Taking Delsym cough medicine this past week and cough drops.    Past Medical History  Diagnosis Date  . Arthritis     autoimmune rheumatoid arthritis  . History of chicken pox   . Depression   . GERD (gastroesophageal reflux disease)   . Fibromyalgia    Past Surgical History  Procedure Laterality Date  . Cesarean section  2009  . Cesarean section  2012  . Tubal ligation  2012   Family History  Problem Relation Age of Onset  . Arthritis Mother     spondylitis  . Heart disease Father   . Diabetes Maternal Uncle   . Arthritis Maternal Grandmother   . Arthritis Maternal Grandfather   . Arthritis Paternal Grandmother     RA  . Arthritis Paternal Grandfather   . Hyperlipidemia Paternal Grandfather   . Hypertension Paternal Grandfather   . Stroke Paternal Grandfather   . Kidney disease Paternal Grandfather   . Heart disease Paternal Grandfather    History  Substance Use Topics  . Smoking status: Never Smoker   . Smokeless tobacco: Never Used  . Alcohol Use: Yes     Comment: 1-2 drinks per month   OB History    No data available     Review of Systems  All other systems reviewed and are negative  Allergies  Review of patient's allergies indicates no known allergies.  Home Medications   Prior to Admission medications   Medication Sig Start Date End Date Taking? Authorizing Provider  amitriptyline (ELAVIL) 25 MG tablet Take 1 tablet (25 mg total) by mouth at bedtime. 03/08/13   Sandford Craze, NP  cyclobenzaprine (FLEXERIL) 5 MG tablet Take 1 tablet (5 mg total) by mouth 3 (three) times daily as needed for muscle spasms. 03/08/13   Sandford Craze, NP   dicyclomine (BENTYL) 20 MG tablet Take 1 tablet (20 mg total) by mouth 2 (two) times daily. 12/11/13   Shari A Upstill, PA-C  FLUOCINOLONE ACETONIDE SCALP 0.01 % OIL Apply 1 application topically 2 (two) times daily. 03/08/13   Sandford Craze, NP  methylPREDNISolone (MEDROL DOSEPAK) 4 MG tablet follow package directions 03/08/13   Sandford Craze, NP  omeprazole (PRILOSEC) 40 MG capsule Take 40 mg by mouth daily.    Historical Provider, MD  ondansetron (ZOFRAN) 4 MG tablet Take 1 tablet (4 mg total) by mouth every 6 (six) hours. 12/11/13   Shari A Upstill, PA-C  pantoprazole (PROTONIX) 40 MG tablet Take 1 tablet (40 mg total) by mouth daily. 04/21/13   Sandford Craze, NP   BP 129/79 mmHg  Pulse 89  Temp(Src) 98.3 F (36.8 C) (Oral)  Resp 20  Ht 5\' 3"  (1.6 m)  Wt 180 lb (81.647 kg)  BMI 31.89 kg/m2  SpO2 98%  LMP 01/28/2014 Physical Exam Physical Exam  Nursing note and vitals reviewed. Constitutional: She is oriented to person, place, and time. She appears well-developed and well-nourished. No distress.  HENT:  Head: Normocephalic and atraumatic.  Eyes: Pupils are equal, round, and reactive to light.  Neck: Normal range of motion.  Cardiovascular: Normal rate  and intact distal pulses.   Pulmonary/Chest: No respiratory distress.  Mild expiratory wheezing.   Abdominal: Normal appearance. She exhibits no distension.  Musculoskeletal: Normal range of motion.  Neurological: She is alert and oriented to person, place, and time. No cranial nerve deficit.  Skin: Skin is warm and dry. No rash noted.  Psychiatric: She has a normal mood and affect. Her behavior is normal.   ED Course  Procedures (including critical care time)  Medications  albuterol (PROVENTIL HFA;VENTOLIN HFA) 108 (90 BASE) MCG/ACT inhaler 2 puff (not administered)    Labs Review Labs Reviewed - No data to display  Imaging Review Dg Chest 2 View  02/18/2014   CLINICAL DATA:  Productive cough and wheezing   EXAM: CHEST  2 VIEW  COMPARISON:  09/18/2012  FINDINGS: The heart size and mediastinal contours are within normal limits. Both lungs are clear. The visualized skeletal structures are unremarkable.  IMPRESSION: No active cardiopulmonary disease.   Electronically Signed   By: Alcide Clever M.D.   On: 02/18/2014 11:02      MDM   Final diagnoses:  Cough        Nelia Shi, MD 02/18/14 1113

## 2014-02-18 NOTE — Discharge Instructions (Signed)
Cough, Adult   A cough is a reflex. It helps you clear your throat and airways. A cough can help heal your body. A cough can last 2 or 3 weeks (acute) or may last more than 8 weeks (chronic). Some common causes of a cough can include an infection, allergy, or a cold.  HOME CARE  · Only take medicine as told by your doctor.  · If given, take your medicines (antibiotics) as told. Finish them even if you start to feel better.  · Use a cold steam vaporizer or humidifier in your home. This can help loosen thick spit (secretions).  · Sleep so you are almost sitting up (semi-upright). Use pillows to do this. This helps reduce coughing.  · Rest as needed.  · Stop smoking if you smoke.  GET HELP RIGHT AWAY IF:  · You have yellowish-white fluid (pus) in your thick spit.  · Your cough gets worse.  · Your medicine does not reduce coughing, and you are losing sleep.  · You cough up blood.  · You have trouble breathing.  · Your pain gets worse and medicine does not help.  · You have a fever.  MAKE SURE YOU:   · Understand these instructions.  · Will watch your condition.  · Will get help right away if you are not doing well or get worse.  Document Released: 11/22/2010 Document Revised: 07/26/2013 Document Reviewed: 11/22/2010  [employer]® Patient Information ©2015 [employer]. This information is not intended to replace advice given to you by your health care provider. Make sure you discuss any questions you have with your health care provider.

## 2014-03-07 ENCOUNTER — Emergency Department (HOSPITAL_BASED_OUTPATIENT_CLINIC_OR_DEPARTMENT_OTHER): Payer: PRIVATE HEALTH INSURANCE

## 2014-03-07 ENCOUNTER — Emergency Department (HOSPITAL_BASED_OUTPATIENT_CLINIC_OR_DEPARTMENT_OTHER)
Admission: EM | Admit: 2014-03-07 | Discharge: 2014-03-07 | Disposition: A | Discharge status: Home / Self Care | Payer: PRIVATE HEALTH INSURANCE | Attending: Emergency Medicine | Admitting: Emergency Medicine

## 2014-03-07 ENCOUNTER — Encounter (HOSPITAL_BASED_OUTPATIENT_CLINIC_OR_DEPARTMENT_OTHER): Payer: Self-pay

## 2014-03-07 DIAGNOSIS — Z8739 Personal history of other diseases of the musculoskeletal system and connective tissue: Secondary | ICD-10-CM | POA: Insufficient documentation

## 2014-03-07 DIAGNOSIS — K219 Gastro-esophageal reflux disease without esophagitis: Secondary | ICD-10-CM | POA: Insufficient documentation

## 2014-03-07 DIAGNOSIS — Z8619 Personal history of other infectious and parasitic diseases: Secondary | ICD-10-CM | POA: Insufficient documentation

## 2014-03-07 DIAGNOSIS — Z7952 Long term (current) use of systemic steroids: Secondary | ICD-10-CM | POA: Insufficient documentation

## 2014-03-07 DIAGNOSIS — F329 Major depressive disorder, single episode, unspecified: Secondary | ICD-10-CM | POA: Insufficient documentation

## 2014-03-07 DIAGNOSIS — J40 Bronchitis, not specified as acute or chronic: Secondary | ICD-10-CM

## 2014-03-07 DIAGNOSIS — R059 Cough, unspecified: Secondary | ICD-10-CM

## 2014-03-07 DIAGNOSIS — Z79899 Other long term (current) drug therapy: Secondary | ICD-10-CM | POA: Insufficient documentation

## 2014-03-07 DIAGNOSIS — R05 Cough: Secondary | ICD-10-CM

## 2014-03-07 LAB — BASIC METABOLIC PANEL
ANION GAP: 14 (ref 5–15)
BUN: 10 mg/dL (ref 6–23)
CHLORIDE: 102 meq/L (ref 96–112)
CO2: 25 meq/L (ref 19–32)
Calcium: 9.2 mg/dL (ref 8.4–10.5)
Creatinine, Ser: 0.8 mg/dL (ref 0.50–1.10)
GFR calc non Af Amer: >90 mL/min (ref >90)
Glucose, Bld: 113 mg/dL — ABNORMAL HIGH (ref 70–99)
POTASSIUM: 4.5 meq/L (ref 3.7–5.3)
SODIUM: 141 meq/L (ref 137–147)

## 2014-03-07 LAB — CBC WITH DIFFERENTIAL/PLATELET
BASOS PCT: 0 % (ref 0–1)
Basophils Absolute: 0 10*3/uL (ref 0.0–0.1)
Eosinophils Absolute: 0.1 10*3/uL (ref 0.0–0.7)
Eosinophils Relative: 1 % (ref 0–5)
HCT: 37.5 % (ref 36.0–46.0)
HEMOGLOBIN: 12.5 g/dL (ref 12.0–15.0)
LYMPHS ABS: 2.1 10*3/uL (ref 0.7–4.0)
LYMPHS PCT: 23 % (ref 12–46)
MCH: 31.7 pg (ref 26.0–34.0)
MCHC: 33.3 g/dL (ref 30.0–36.0)
MCV: 95.2 fL (ref 78.0–100.0)
MONOS PCT: 8 % (ref 3–12)
Monocytes Absolute: 0.7 10*3/uL (ref 0.1–1.0)
NEUTROS ABS: 6.2 10*3/uL (ref 1.7–7.7)
NEUTROS PCT: 68 % (ref 43–77)
Platelets: 285 10*3/uL (ref 150–400)
RBC: 3.94 MIL/uL (ref 3.87–5.11)
RDW: 13.9 % (ref 11.5–15.5)
WBC: 9.1 10*3/uL (ref 4.0–10.5)

## 2014-03-07 MED ORDER — DM-GUAIFENESIN ER 30-600 MG PO TB12: 1.0000 | ORAL_TABLET | Freq: Two times a day (BID) | ORAL | Status: DC

## 2014-03-07 NOTE — ED Provider Notes (Signed)
CSN: [id number]     Arrival date & time 03/07/14  1237 History   First MD Initiated Contact with Patient 03/07/14 1408     Chief Complaint  Patient presents with  . Shortness of Breath     (Consider location/radiation/quality/duration/timing/severity/associated sxs/prior Treatment) Patient is a 33 y.o. female presenting with shortness of breath. The history is provided by the patient.  Shortness of Breath Associated symptoms: no abdominal pain, no chest pain, no fever, no headaches and no rash    patient with three-week history of cough some congestion cough occasionally productive. Today he had a coughing episode and passed out at work. Patient was seen November 27 with a negative chest x-ray. No evidence of pneumonia at that time. Patient given albuterol inhaler which she's been using. Patient states she just not getting better. No fevers no new or worse symptoms just persistent. Patient coughs so much today that she passed out she was having a coughing spell when it occurred. Patient does feel short of breath throughout this illness.  Past Medical History  Diagnosis Date  . Arthritis     autoimmune rheumatoid arthritis  . History of chicken pox   . Depression   . GERD (gastroesophageal reflux disease)   . Fibromyalgia    Past Surgical History  Procedure Laterality Date  . Cesarean section  2009  . Cesarean section  2012  . Tubal ligation  2012   Family History  Problem Relation Age of Onset  . Arthritis Mother     spondylitis  . Heart disease Father   . Diabetes Maternal Uncle   . Arthritis Maternal Grandmother   . Arthritis Maternal Grandfather   . Arthritis Paternal Grandmother     RA  . Arthritis Paternal Grandfather   . Hyperlipidemia Paternal Grandfather   . Hypertension Paternal Grandfather   . Stroke Paternal Grandfather   . Kidney disease Paternal Grandfather   . Heart disease Paternal Grandfather    History  Substance Use Topics  . Smoking status: Never  Smoker   . Smokeless tobacco: Never Used  . Alcohol Use: Yes     Comment: 1-2 drinks per month   OB History    No data available     Review of Systems  Constitutional: Positive for fatigue. Negative for fever.  HENT: Negative for congestion.   Eyes: Negative for redness.  Respiratory: Positive for shortness of breath.   Cardiovascular: Negative for chest pain.  Gastrointestinal: Negative for abdominal pain.  Genitourinary: Negative for dysuria.  Musculoskeletal: Negative for back pain.  Skin: Negative for rash.  Neurological: Positive for syncope. Negative for headaches.  Hematological: Does not bruise/bleed easily.  Psychiatric/Behavioral: Negative for confusion.      Allergies  Review of patient's allergies indicates no known allergies.  Home Medications   Prior to Admission medications   Medication Sig Start Date End Date Taking? Authorizing Provider  albuterol (PROVENTIL HFA;VENTOLIN HFA) 108 (90 BASE) MCG/ACT inhaler Inhale into the lungs every 6 (six) hours as needed for wheezing or shortness of breath.   Yes Historical Provider, MD  beclomethasone (QVAR) 80 MCG/ACT inhaler Inhale into the lungs 2 (two) times daily.   Yes Historical Provider, MD  amitriptyline (ELAVIL) 25 MG tablet Take 1 tablet (25 mg total) by mouth at bedtime. 03/08/13   Sandford Craze, NP  cyclobenzaprine (FLEXERIL) 5 MG tablet Take 1 tablet (5 mg total) by mouth 3 (three) times daily as needed for muscle spasms. 03/08/13   Sandford Craze, NP  dextromethorphan-guaiFENesin (  MUCINEX DM) 30-600 MG per 12 hr tablet Take 1 tablet by mouth 2 (two) times daily. 03/07/14   Vanetta Mulders, MD  dicyclomine (BENTYL) 20 MG tablet Take 1 tablet (20 mg total) by mouth 2 (two) times daily. 12/11/13   Shari A Upstill, PA-C  FLUOCINOLONE ACETONIDE SCALP 0.01 % OIL Apply 1 application topically 2 (two) times daily. 03/08/13   Sandford Craze, NP  methylPREDNISolone (MEDROL DOSEPAK) 4 MG tablet follow  package directions 03/08/13   Sandford Craze, NP  omeprazole (PRILOSEC) 40 MG capsule Take 40 mg by mouth daily.    Historical Provider, MD  ondansetron (ZOFRAN) 4 MG tablet Take 1 tablet (4 mg total) by mouth every 6 (six) hours. 12/11/13   Shari A Upstill, PA-C  pantoprazole (PROTONIX) 40 MG tablet Take 1 tablet (40 mg total) by mouth daily. 04/21/13   Sandford Craze, NP   BP 115/63 mmHg  Pulse 84  Temp(Src) 98.2 F (36.8 C) (Oral)  Resp 18  Ht 5\' 3"  (1.6 m)  Wt 185 lb (83.915 kg)  BMI 32.78 kg/m2  SpO2 100%  LMP 02/21/2014 Physical Exam  Constitutional: She is oriented to person, place, and time. She appears well-developed and well-nourished. No distress.  HENT:  Head: Normocephalic and atraumatic.  Mouth/Throat: Oropharynx is clear and moist.  Eyes: Conjunctivae and EOM are normal. Pupils are equal, round, and reactive to light.  Neck: Normal range of motion.  Cardiovascular: Normal rate, regular rhythm and normal heart sounds.   No murmur heard. Pulmonary/Chest: Effort normal and breath sounds normal. No respiratory distress.  Abdominal: Soft. Bowel sounds are normal. There is no tenderness.  Musculoskeletal: Normal range of motion.  Neurological: She is alert and oriented to person, place, and time. No cranial nerve deficit. She exhibits normal muscle tone. Coordination normal.  Skin: Skin is warm. No rash noted.  Nursing note and vitals reviewed.   ED Course  Procedures (including critical care time) Labs Review Labs Reviewed  BASIC METABOLIC PANEL - Abnormal; Notable for the following:    Glucose, Bld 113 (*)    All other components within normal limits  CBC WITH DIFFERENTIAL   Results for orders placed or performed during the hospital encounter of 03/07/14  Basic metabolic panel  Result Value Ref Range   Sodium 141 137 - 147 mEq/L   Potassium 4.5 3.7 - 5.3 mEq/L   Chloride 102 96 - 112 mEq/L   CO2 25 19 - 32 mEq/L   Glucose, Bld 113 (H) 70 - 99 mg/dL    BUN 10 6 - 23 mg/dL   Creatinine, Ser 03/09/14 0.50 - 1.10 mg/dL   Calcium 9.2 8.4 - 2.63 mg/dL   GFR calc non Af Amer >90 >90 mL/min   GFR calc Af Amer >90 >90 mL/min   Anion gap 14 5 - 15  CBC with Differential  Result Value Ref Range   WBC 9.1 4.0 - 10.5 K/uL   RBC 3.94 3.87 - 5.11 MIL/uL   Hemoglobin 12.5 12.0 - 15.0 g/dL   HCT 78.5 88.5 - 02.7 %   MCV 95.2 78.0 - 100.0 fL   MCH 31.7 26.0 - 34.0 pg   MCHC 33.3 30.0 - 36.0 g/dL   RDW 74.1 28.7 - 86.7 %   Platelets 285 150 - 400 K/uL   Neutrophils Relative % 68 43 - 77 %   Neutro Abs 6.2 1.7 - 7.7 K/uL   Lymphocytes Relative 23 12 - 46 %   Lymphs Abs 2.1 0.7 -  4.0 K/uL   Monocytes Relative 8 3 - 12 %   Monocytes Absolute 0.7 0.1 - 1.0 K/uL   Eosinophils Relative 1 0 - 5 %   Eosinophils Absolute 0.1 0.0 - 0.7 K/uL   Basophils Relative 0 0 - 1 %   Basophils Absolute 0.0 0.0 - 0.1 K/uL     Imaging Review Dg Chest 2 View  03/07/2014   CLINICAL DATA:  Cough and congestion for 1 month  EXAM: CHEST  2 VIEW  COMPARISON:  02/18/2014  FINDINGS: Normal heart size, mediastinal contours, and pulmonary vascularity.  Lungs clear.  No pneumothorax.  Bones unremarkable.  IMPRESSION: Normal exam   Electronically Signed   By: Ulyses Southward M.D.   On: 03/07/2014 13:47     EKG Interpretation   Date/Time:  Monday March 07 2014 12:44:12 EST Ventricular Rate:  93 PR Interval:  138 QRS Duration: 78 QT Interval:  360 QTC Calculation: 447 R Axis:   55 Text Interpretation:  Normal sinus rhythm Normal ECG No previous ECGs  available Confirmed by Sergio Hobart  MD, Janine Reller ([id number]) on 03/07/2014 2:11:24  PM      MDM   Final diagnoses:  Bronchitis    Chest x-ray negative. No significant lab abnormalities. Symptoms seem to be consistent with a persistent cough no evidence of pneumonia on chest x-ray no pneumothorax. Patient was coughing heavily when she had the passing out episode today. Patient without any abdominal pain no lightheadedness. No  headaches no fever. Patient already taking albuterol and will continue that. Started on Mucinex DM. Work note provided for today. EKG without any arrhythmias or any acute findings.  Also patient without any hypoxia or persistent tachycardia. To be suggestive of a pulmonary embolus. Patient also based on perc criteria without any risk factors.  Vanetta Mulders, MD 03/07/14 [phone]

## 2014-03-07 NOTE — Discharge Instructions (Signed)
Cough, Adult  A cough is a reflex. It helps you clear your throat and airways. A cough can help heal your body. A cough can last 2 or 3 weeks (acute) or may last more than 8 weeks (chronic). Some common causes of a cough can include an infection, allergy, or a cold. HOME CARE  Only take medicine as told by your doctor.  If given, take your medicines (antibiotics) as told. Finish them even if you start to feel better.  Use a cold steam vaporizer or humidifier in your home. This can help loosen thick spit (secretions).  Sleep so you are almost sitting up (semi-upright). Use pillows to do this. This helps reduce coughing.  Rest as needed.  Stop smoking if you smoke. GET HELP RIGHT AWAY IF:  You have yellowish-white fluid (pus) in your thick spit.  Your cough gets worse.  Your medicine does not reduce coughing, and you are losing sleep.  You cough up blood.  You have trouble breathing.  Your pain gets worse and medicine does not help.  You have a fever. MAKE SURE YOU:   Understand these instructions.  Will watch your condition.  Will get help right away if you are not doing well or get worse. Document Released: 11/22/2010 Document Revised: 07/26/2013 Document Reviewed: 11/22/2010 [facility] Patient Information 2015 [place], [place]. This information is not intended to replace advice given to you by your health care provider. Make sure you discuss any questions you have with your health care provider.  Continue albuterol inhaler. Start taking the Mucinex DM. Return for any new or worse symptoms. Work note provided for today.

## 2014-03-07 NOTE — ED Notes (Signed)
Pt reports cough x 3 weeks.  Pt reports its gradual getting worse adn reports she can't breathe.  Pt reports she passed out at work today and was sent here by her boss.

## 2014-07-31 ENCOUNTER — Encounter (HOSPITAL_BASED_OUTPATIENT_CLINIC_OR_DEPARTMENT_OTHER): Payer: Self-pay

## 2014-07-31 ENCOUNTER — Emergency Department (HOSPITAL_BASED_OUTPATIENT_CLINIC_OR_DEPARTMENT_OTHER)
Admission: EM | Admit: 2014-07-31 | Discharge: 2014-07-31 | Disposition: A | Discharge status: Home / Self Care | Payer: PRIVATE HEALTH INSURANCE | Attending: Emergency Medicine | Admitting: Emergency Medicine

## 2014-07-31 DIAGNOSIS — K219 Gastro-esophageal reflux disease without esophagitis: Secondary | ICD-10-CM | POA: Insufficient documentation

## 2014-07-31 DIAGNOSIS — Z3202 Encounter for pregnancy test, result negative: Secondary | ICD-10-CM | POA: Insufficient documentation

## 2014-07-31 DIAGNOSIS — Z79899 Other long term (current) drug therapy: Secondary | ICD-10-CM | POA: Insufficient documentation

## 2014-07-31 DIAGNOSIS — F329 Major depressive disorder, single episode, unspecified: Secondary | ICD-10-CM | POA: Insufficient documentation

## 2014-07-31 DIAGNOSIS — Z8619 Personal history of other infectious and parasitic diseases: Secondary | ICD-10-CM | POA: Insufficient documentation

## 2014-07-31 DIAGNOSIS — Z7952 Long term (current) use of systemic steroids: Secondary | ICD-10-CM | POA: Insufficient documentation

## 2014-07-31 DIAGNOSIS — M545 Low back pain, unspecified: Secondary | ICD-10-CM

## 2014-07-31 DIAGNOSIS — M1990 Unspecified osteoarthritis, unspecified site: Secondary | ICD-10-CM | POA: Insufficient documentation

## 2014-07-31 LAB — URINALYSIS, ROUTINE W REFLEX MICROSCOPIC
BILIRUBIN URINE: NEGATIVE
GLUCOSE, UA: NEGATIVE mg/dL
HGB URINE DIPSTICK: NEGATIVE
KETONES UR: NEGATIVE mg/dL
NITRITE: NEGATIVE
Protein, ur: NEGATIVE mg/dL
Specific Gravity, Urine: 1.018 (ref 1.005–1.030)
UROBILINOGEN UA: 1 mg/dL (ref 0.0–1.0)
pH: 7.5 (ref 5.0–8.0)

## 2014-07-31 LAB — URINE MICROSCOPIC-ADD ON

## 2014-07-31 LAB — PREGNANCY, URINE: PREG TEST UR: NEGATIVE

## 2014-07-31 MED ORDER — HYDROCODONE-ACETAMINOPHEN 5-325 MG PO TABS: 1.0000 | ORAL_TABLET | Freq: Four times a day (QID) | ORAL | Status: DC | PRN

## 2014-07-31 MED ORDER — KETOROLAC TROMETHAMINE 30 MG/ML IJ SOLN
INTRAMUSCULAR | Status: AC
  Filled 2014-07-31: qty 2

## 2014-07-31 MED ORDER — KETOROLAC TROMETHAMINE 60 MG/2ML IM SOLN
60.0000 mg | Freq: Once | INTRAMUSCULAR | Status: AC
  Administered 2014-07-31: 60 mg via INTRAMUSCULAR

## 2014-07-31 MED ORDER — CYCLOBENZAPRINE HCL 5 MG PO TABS: 5.0000 mg | ORAL_TABLET | Freq: Three times a day (TID) | ORAL | Status: DC | PRN

## 2014-07-31 NOTE — ED Notes (Signed)
States has tried heating pad and has taken 800mg  Ibuprofen this am , both w/o relief of back pain

## 2014-07-31 NOTE — ED Provider Notes (Signed)
CSN: [id number]     Arrival date & time 07/31/14  1740 History  This chart was scribed for Brandi Fossa, MD by Roxy Cedar, ED Scribe. This patient was seen in room MH11/MH11 and the patient's care was started at 6:29 PM.   Chief Complaint  Patient presents with  . Back Pain   Patient is a 34 y.o. female presenting with back pain. The history is provided by the patient. No language interpreter was used.  Back Pain  HPI Comments: Brandi Parrish is a 34 y.o. 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 Pain is exacerbated by movement. Patient reports hx of spraining her back 1 year ago but states that the pain did not radiate down her leg during previous episode. Patient has no known allergies. She denies associated numbness, weakness, or loss of bladder or bowel control. Patient denies prior hx of kidney stones. No dysuria, fevers, vomiting, abdominal pain, discharge.   Past Medical History  Diagnosis Date  . Arthritis     autoimmune rheumatoid arthritis  . History of chicken pox   . Depression   . GERD (gastroesophageal reflux disease)   . Fibromyalgia    Past Surgical History  Procedure Laterality Date  . Cesarean section  2009  . Cesarean section  2012  . Tubal ligation  2012   Family History  Problem Relation Age of Onset  . Arthritis Mother     spondylitis  . Heart disease Father   . Diabetes Maternal Uncle   . Arthritis Maternal Grandmother   . Arthritis Maternal Grandfather   . Arthritis Paternal Grandmother     RA  . Arthritis Paternal Grandfather   . Hyperlipidemia Paternal Grandfather   . Hypertension Paternal Grandfather   . Stroke Paternal Grandfather   . Kidney disease Paternal Grandfather   . Heart disease Paternal  Grandfather    History  Substance Use Topics  . Smoking status: Never Smoker   . Smokeless tobacco: Never Used  . Alcohol Use: Yes     Comment: 1-2 drinks per month   OB History    No data available     Review of Systems  Musculoskeletal: Positive for back pain.  All other systems reviewed and are negative.  Allergies  Review of patient's allergies indicates no known allergies.  Home Medications   Prior to Admission medications   Medication Sig Start Date End Date Taking? Authorizing Provider  albuterol (PROVENTIL HFA;VENTOLIN HFA) 108 (90 BASE) MCG/ACT inhaler Inhale into the lungs every 6 (six) hours as needed for wheezing or shortness of breath.    Historical Provider, MD  amitriptyline (ELAVIL) 25 MG tablet Take 1 tablet (25 mg total) by mouth at bedtime. 03/08/13   Sandford Craze, NP  beclomethasone (QVAR) 80 MCG/ACT inhaler Inhale into the lungs 2 (two) times daily.    Historical Provider, MD  cyclobenzaprine (FLEXERIL) 5 MG tablet Take 1 tablet (5 mg total) by mouth 3 (three) times daily as needed for muscle spasms. 03/08/13   Sandford Craze, NP  dextromethorphan-guaiFENesin (MUCINEX DM) 30-600 MG per 12 hr tablet Take 1 tablet by mouth 2 (two) times daily. 03/07/14   Vanetta Mulders, MD  dicyclomine (BENTYL) 20 MG tablet Take 1 tablet (20 mg total) by mouth 2 (two) times daily.  12/11/13   Shari Upstill, PA-C  FLUOCINOLONE ACETONIDE SCALP 0.01 % OIL Apply 1 application topically 2 (two) times daily. 03/08/13   Sandford Craze, NP  methylPREDNISolone (MEDROL DOSEPAK) 4 MG tablet follow package directions 03/08/13   Sandford Craze, NP  omeprazole (PRILOSEC) 40 MG capsule Take 40 mg by mouth daily.    Historical Provider, MD  ondansetron (ZOFRAN) 4 MG tablet Take 1 tablet (4 mg total) by mouth every 6 (six) hours. 12/11/13   Elpidio Anis, PA-C  pantoprazole (PROTONIX) 40 MG tablet Take 1 tablet (40 mg total) by mouth daily. 04/21/13   Sandford Craze, NP    Triage Vitals: BP 131/76 mmHg  Pulse 94  Temp(Src) 97.8 F (36.6 C) (Oral)  Resp 21  Ht 5\' 3"  (1.6 m)  Wt 190 lb (86.183 kg)  BMI 33.67 kg/m2  SpO2 100%  LMP 07/01/2014  Physical Exam  Constitutional: She is oriented to person, place, and time. She appears well-developed and well-nourished.  HENT:  Head: Normocephalic and atraumatic.  Cardiovascular: Normal rate and regular rhythm.   No murmur heard. 2+ DP pulses bilaterally  Pulmonary/Chest: Effort normal and breath sounds normal. No respiratory distress.  Abdominal: Soft. There is no tenderness. There is no rebound and no guarding.  Musculoskeletal: She exhibits no edema or tenderness.  Diffuse tenderness along lower back, slightly greater over right lower back.  No CVA tenderness.    Neurological: She is alert and oriented to person, place, and time.  5/5 strength in BLE, sensation to light touch intact in BLE.  No ankle clonus bilaterally.   Skin: Skin is warm and dry.  Psychiatric: She has a normal mood and affect. Her behavior is normal.  Nursing note and vitals reviewed.  ED Course  Procedures (including critical care time)  DIAGNOSTIC STUDIES: Oxygen Saturation is 100% on RA, normal by my interpretation.    COORDINATION OF CARE: 6:36 PM- Discussed plans to order diagnostic urinalysis. Will give patient injection of antiinflammatory medication. Will give patient muscle relaxer medication. Advised patient to return if she has onset of bladder or bowel incontinence. Pt advised of plan for treatment and pt agrees.  Labs Review Labs Reviewed  URINALYSIS, ROUTINE W REFLEX MICROSCOPIC - Abnormal; Notable for the following:    APPearance TURBID (*)    Leukocytes, UA MODERATE (*)    All other components within normal limits  URINE MICROSCOPIC-ADD ON - Abnormal; Notable for the following:    Bacteria, UA MANY (*)    All other components within normal limits  URINE CULTURE  PREGNANCY, URINE   Imaging Review No  results found.   EKG Interpretation None     MDM   Final diagnoses:  Acute low back pain    Patient here for evaluation of low back pain that started during lifting.  No neurologic deficits on examination.  Hx is not c/w cauda equina, renal colic, acute fracture/dislocation, epidural abscess. Patient does have some bacteriuria but no dysuria or frequency. Cultures pending.  Discussed with patient MSK back pain as well as return precautions.    I personally performed the services described in this documentation, which was scribed in my presence. The recorded information has been reviewed and is accurate.   08/31/2014, MD 07/31/14 [phone]

## 2014-07-31 NOTE — ED Notes (Signed)
Pt reports yesterday was moving large tubs in her building outside and today with muscle spasms and lower lumbar pain, denies n/t, full control of bowel/bladder.  Radiation of pain down right leg.

## 2014-07-31 NOTE — Discharge Instructions (Signed)
Back Pain, Adult Low back pain is very common. About 1 in 5 people have back pain.The cause of low back pain is rarely dangerous. The pain often gets better over time.About half of people with a sudden onset of back pain feel better in just 2 weeks. About 8 in 10 people feel better by 6 weeks.  CAUSES Some common causes of back pain include:  Strain of the muscles or ligaments supporting the spine.  Wear and tear (degeneration) of the spinal discs.  Arthritis.  Direct injury to the back. DIAGNOSIS Most of the time, the direct cause of low back pain is not known.However, back pain can be treated effectively even when the exact cause of the pain is unknown.Answering your caregiver's questions about your overall health and symptoms is one of the most accurate ways to make sure the cause of your pain is not dangerous. If your caregiver needs more information, he or she may order lab work or imaging tests (X-rays or MRIs).However, even if imaging tests show changes in your back, this usually does not require surgery. HOME CARE INSTRUCTIONS For many people, back pain returns.Since low back pain is rarely dangerous, it is often a condition that people can learn to manageon their own.   Remain active. It is stressful on the back to sit or stand in one place. Do not sit, drive, or stand in one place for more than 30 minutes at a time. Take short walks on level surfaces as soon as pain allows.Try to increase the length of time you walk each day.  Do not stay in bed.Resting more than 1 or 2 days can delay your recovery.  Do not avoid exercise or work.Your body is made to move.It is not dangerous to be active, even though your back may hurt.Your back will likely heal faster if you return to being active before your pain is gone.  Pay attention to your body when you bend and lift. Many people have less discomfortwhen lifting if they bend their knees, keep the load close to their bodies,and  avoid twisting. Often, the most comfortable positions are those that put less stress on your recovering back.  Find a comfortable position to sleep. Use a firm mattress and lie on your side with your knees slightly bent. If you lie on your back, put a pillow under your knees.  Only take over-the-counter or prescription medicines as directed by your caregiver. Over-the-counter medicines to reduce pain and inflammation are often the most helpful.Your caregiver may prescribe muscle relaxant drugs.These medicines help dull your pain so you can more quickly return to your normal activities and healthy exercise.  Put ice on the injured area.  Put ice in a plastic bag.  Place a towel between your skin and the bag.  Leave the ice on for 15-20 minutes, 03-04 times a day for the first 2 to 3 days. After that, ice and heat may be alternated to reduce pain and spasms.  Ask your caregiver about trying back exercises and gentle massage. This may be of some benefit.  Avoid feeling anxious or stressed.Stress increases muscle tension and can worsen back pain.It is important to recognize when you are anxious or stressed and learn ways to manage it.Exercise is a great option. SEEK MEDICAL CARE IF:  You have pain that is not relieved with rest or medicine.  You have pain that does not improve in 1 week.  You have new symptoms.  You are generally not feeling well. SEEK   IMMEDIATE MEDICAL CARE IF:   You have pain that radiates from your back into your legs.  You develop new bowel or bladder control problems.  You have unusual weakness or numbness in your arms or legs.  You develop nausea or vomiting.  You develop abdominal pain.  You feel faint. Document Released: 03/11/2005 Document Revised: 09/10/2011 Document Reviewed: 07/13/2013 [employer] Patient Information 2015 [employer]. This information is not intended to replace advice given to you by your health care provider. Make sure you  discuss any questions you have with your health care provider.  

## 2014-07-31 NOTE — ED Notes (Signed)
Presents to ED with c/o lower back pain, states that yesterday was moving "totes" around, the heaviest tote was 50lbs. Onset back pain began last PM. States has intermittent back spasms which radiates down Rt leg

## 2014-08-01 LAB — URINE CULTURE

## 2014-10-22 ENCOUNTER — Emergency Department (HOSPITAL_BASED_OUTPATIENT_CLINIC_OR_DEPARTMENT_OTHER): Payer: PRIVATE HEALTH INSURANCE

## 2014-10-22 ENCOUNTER — Emergency Department (HOSPITAL_BASED_OUTPATIENT_CLINIC_OR_DEPARTMENT_OTHER)
Admission: EM | Admit: 2014-10-22 | Discharge: 2014-10-23 | Disposition: A | Discharge status: Home / Self Care | Payer: PRIVATE HEALTH INSURANCE | Attending: Emergency Medicine | Admitting: Emergency Medicine

## 2014-10-22 ENCOUNTER — Encounter (HOSPITAL_BASED_OUTPATIENT_CLINIC_OR_DEPARTMENT_OTHER): Payer: Self-pay | Admitting: *Deleted

## 2014-10-22 DIAGNOSIS — M545 Low back pain, unspecified: Secondary | ICD-10-CM

## 2014-10-22 DIAGNOSIS — K219 Gastro-esophageal reflux disease without esophagitis: Secondary | ICD-10-CM | POA: Insufficient documentation

## 2014-10-22 DIAGNOSIS — R202 Paresthesia of skin: Secondary | ICD-10-CM | POA: Insufficient documentation

## 2014-10-22 DIAGNOSIS — Z8659 Personal history of other mental and behavioral disorders: Secondary | ICD-10-CM | POA: Insufficient documentation

## 2014-10-22 DIAGNOSIS — R200 Anesthesia of skin: Secondary | ICD-10-CM | POA: Insufficient documentation

## 2014-10-22 DIAGNOSIS — Z3202 Encounter for pregnancy test, result negative: Secondary | ICD-10-CM | POA: Insufficient documentation

## 2014-10-22 DIAGNOSIS — Z79899 Other long term (current) drug therapy: Secondary | ICD-10-CM | POA: Insufficient documentation

## 2014-10-22 DIAGNOSIS — M069 Rheumatoid arthritis, unspecified: Secondary | ICD-10-CM | POA: Insufficient documentation

## 2014-10-22 DIAGNOSIS — Z7951 Long term (current) use of inhaled steroids: Secondary | ICD-10-CM | POA: Insufficient documentation

## 2014-10-22 DIAGNOSIS — Z8619 Personal history of other infectious and parasitic diseases: Secondary | ICD-10-CM | POA: Insufficient documentation

## 2014-10-22 DIAGNOSIS — M797 Fibromyalgia: Secondary | ICD-10-CM | POA: Insufficient documentation

## 2014-10-22 LAB — URINE MICROSCOPIC-ADD ON

## 2014-10-22 LAB — URINALYSIS, ROUTINE W REFLEX MICROSCOPIC
Bilirubin Urine: NEGATIVE
Glucose, UA: NEGATIVE mg/dL
KETONES UR: NEGATIVE mg/dL
LEUKOCYTES UA: NEGATIVE
NITRITE: NEGATIVE
PH: 6 (ref 5.0–8.0)
PROTEIN: NEGATIVE mg/dL
Specific Gravity, Urine: 1.027 (ref 1.005–1.030)
Urobilinogen, UA: 1 mg/dL (ref 0.0–1.0)

## 2014-10-22 LAB — PREGNANCY, URINE: PREG TEST UR: NEGATIVE

## 2014-10-22 MED ORDER — NAPROXEN 500 MG PO TABS: 500.0000 mg | ORAL_TABLET | Freq: Two times a day (BID) | ORAL | Status: DC

## 2014-10-22 MED ORDER — DIAZEPAM 5 MG PO TABS
5.0000 mg | ORAL_TABLET | Freq: Once | ORAL | Status: AC
  Administered 2014-10-22: 5 mg via ORAL
  Filled 2014-10-22: qty 1

## 2014-10-22 MED ORDER — HYDROCODONE-ACETAMINOPHEN 5-325 MG PO TABS
1.0000 | ORAL_TABLET | Freq: Once | ORAL | Status: AC
  Administered 2014-10-22: 1 via ORAL
  Filled 2014-10-22: qty 1

## 2014-10-22 MED ORDER — CYCLOBENZAPRINE HCL 10 MG PO TABS: 10.0000 mg | ORAL_TABLET | Freq: Two times a day (BID) | ORAL | Status: DC | PRN

## 2014-10-22 NOTE — Discharge Instructions (Signed)
Back Pain, Adult Low back pain is very common. About 1 in 5 people have back pain.The cause of low back pain is rarely dangerous. The pain often gets better over time.About half of people with a sudden onset of back pain feel better in just 2 weeks. About 8 in 10 people feel better by 6 weeks.  CAUSES Some common causes of back pain include:  Strain of the muscles or ligaments supporting the spine.  Wear and tear (degeneration) of the spinal discs.  Arthritis.  Direct injury to the back. DIAGNOSIS Most of the time, the direct cause of low back pain is not known.However, back pain can be treated effectively even when the exact cause of the pain is unknown.Answering your caregiver's questions about your overall health and symptoms is one of the most accurate ways to make sure the cause of your pain is not dangerous. If your caregiver needs more information, he or she may order lab work or imaging tests (X-rays or MRIs).However, even if imaging tests show changes in your back, this usually does not require surgery. HOME CARE INSTRUCTIONS For many people, back pain returns.Since low back pain is rarely dangerous, it is often a condition that people can learn to manageon their own.   Remain active. It is stressful on the back to sit or stand in one place. Do not sit, drive, or stand in one place for more than 30 minutes at a time. Take short walks on level surfaces as soon as pain allows.Try to increase the length of time you walk each day.  Do not stay in bed.Resting more than 1 or 2 days can delay your recovery.  Do not avoid exercise or work.Your body is made to move.It is not dangerous to be active, even though your back may hurt.Your back will likely heal faster if you return to being active before your pain is gone.  Pay attention to your body when you bend and lift. Many people have less discomfortwhen lifting if they bend their knees, keep the load close to their bodies,and  avoid twisting. Often, the most comfortable positions are those that put less stress on your recovering back.  Find a comfortable position to sleep. Use a firm mattress and lie on your side with your knees slightly bent. If you lie on your back, put a pillow under your knees.  Only take over-the-counter or prescription medicines as directed by your caregiver. Over-the-counter medicines to reduce pain and inflammation are often the most helpful.Your caregiver may prescribe muscle relaxant drugs.These medicines help dull your pain so you can more quickly return to your normal activities and healthy exercise.  Put ice on the injured area.  Put ice in a plastic bag.  Place a towel between your skin and the bag.  Leave the ice on for 15-20 minutes, 03-04 times a day for the first 2 to 3 days. After that, ice and heat may be alternated to reduce pain and spasms.  Ask your caregiver about trying back exercises and gentle massage. This may be of some benefit.  Avoid feeling anxious or stressed.Stress increases muscle tension and can worsen back pain.It is important to recognize when you are anxious or stressed and learn ways to manage it.Exercise is a great option. SEEK MEDICAL CARE IF:  You have pain that is not relieved with rest or medicine.  You have pain that does not improve in 1 week.  You have new symptoms.  You are generally not feeling well. SEEK   IMMEDIATE MEDICAL CARE IF:   You have pain that radiates from your back into your legs.  You develop new bowel or bladder control problems.  You have unusual weakness or numbness in your arms or legs.  You develop nausea or vomiting.  You develop abdominal pain.  You feel faint. Document Released: 03/11/2005 Document Revised: 09/10/2011 Document Reviewed: 07/13/2013 [employer] Patient Information 2015 [employer]. This information is not intended to replace advice given to you by your health care provider. Make sure you  discuss any questions you have with your health care provider.  

## 2014-10-22 NOTE — ED Notes (Signed)
Worsening back pain. Pt has been seen for this several times but it is not getting any better.  At times she has sharp shooting pain down both legs.  No numbness or weakness but such intense pain that she has difficulty standing or lifting

## 2014-10-22 NOTE — ED Provider Notes (Signed)
CSN: [id number]     Arrival date & time 10/22/14  2051 History  This chart was scribed for Brandi Octave, MD by Budd Palmer, ED Scribe. This patient was seen in room MH12/MH12 and the patient's care was started at 10:16 PM.    Chief Complaint  Patient presents with  . Back Pain   The history is provided by the patient. No language interpreter was used.   HPI Comments: Brandi Parrish is a 34 y.o. female who presents to the Emergency Department complaining of worsening, chronic, aching, midline lower back pain onset 4 months ago. She states the pain radiates down to her legs and causes occasional numbness and tingling. She has been to the ED for the same, and states that since then the pain has progressively worsened. She denies any falls or injuries. She states that the pain seems to have worsened since she stopped working at [employer] where she would regularly do heavy lifting. She has been taking 4 ibuprofen per day with no relief. She has tried Lyrica, Mobic, and Gabapentin for her rheumatoid arthritis and fibromyalgia with no relief. She denies possible pregnancy and IV drug use. She denies n/v/d, abd pain, dysuria, and urinary or bowel retention.  Past Medical History  Diagnosis Date  . Arthritis     autoimmune rheumatoid arthritis  . History of chicken pox   . Depression   . GERD (gastroesophageal reflux disease)   . Fibromyalgia    Past Surgical History  Procedure Laterality Date  . Cesarean section  2009  . Cesarean section  2012  . Tubal ligation  2012   Family History  Problem Relation Age of Onset  . Arthritis Mother     spondylitis  . Heart disease Father   . Diabetes Maternal Uncle   . Arthritis Maternal Grandmother   . Arthritis Maternal Grandfather   . Arthritis Paternal Grandmother     RA  . Arthritis Paternal Grandfather   . Hyperlipidemia Paternal Grandfather   . Hypertension Paternal Grandfather   . Stroke Paternal Grandfather   . Kidney  disease Paternal Grandfather   . Heart disease Paternal Grandfather    History  Substance Use Topics  . Smoking status: Never Smoker   . Smokeless tobacco: Never Used  . Alcohol Use: Yes     Comment: 1-2 drinks per month   OB History    No data available     Review of Systems A complete 10 system review of systems was obtained and all systems are negative except as noted in the HPI and PMH.    Allergies  Review of patient's allergies indicates no known allergies.  Home Medications   Prior to Admission medications   Medication Sig Start Date End Date Taking? Authorizing Provider  albuterol (PROVENTIL HFA;VENTOLIN HFA) 108 (90 BASE) MCG/ACT inhaler Inhale into the lungs every 6 (six) hours as needed for wheezing or shortness of breath.    Historical Provider, MD  amitriptyline (ELAVIL) 25 MG tablet Take 1 tablet (25 mg total) by mouth at bedtime. 03/08/13   Sandford Craze, NP  beclomethasone (QVAR) 80 MCG/ACT inhaler Inhale into the lungs 2 (two) times daily.    Historical Provider, MD  cyclobenzaprine (FLEXERIL) 10 MG tablet Take 1 tablet (10 mg total) by mouth 2 (two) times daily as needed for muscle spasms. 10/22/14   Brandi Octave, MD  dicyclomine (BENTYL) 20 MG tablet Take 1 tablet (20 mg total) by mouth 2 (two) times daily. 12/11/13   Melvenia Beam  Upstill, PA-C  FLUOCINOLONE ACETONIDE SCALP 0.01 % OIL Apply 1 application topically 2 (two) times daily. 03/08/13   Sandford Craze, NP  HYDROcodone-acetaminophen (NORCO/VICODIN) 5-325 MG per tablet Take 1 tablet by mouth every 6 (six) hours as needed. 07/31/14   [name], MD  naproxen (NAPROSYN) 500 MG tablet Take 1 tablet (500 mg total) by mouth 2 (two) times daily. 10/22/14   Brandi Octave, MD  omeprazole (PRILOSEC) 40 MG capsule Take 40 mg by mouth daily.    Historical Provider, MD  pantoprazole (PROTONIX) 40 MG tablet Take 1 tablet (40 mg total) by mouth daily. 04/21/13   Sandford Craze, NP   BP 123/78 mmHg  Pulse 96   Temp(Src) 98.7 F (37.1 C) (Oral)  Resp 20  Ht 5\' 3"  (1.6 m)  Wt 192 lb (87.091 kg)  BMI 34.02 kg/m2  SpO2 100%  LMP 10/22/2014 (Exact Date) Physical Exam  Constitutional: She is oriented to person, place, and time. She appears well-developed and well-nourished. No distress.  She seems uncomfortable  HENT:  Head: Normocephalic and atraumatic.  Mouth/Throat: Oropharynx is clear and moist. No oropharyngeal exudate.  Eyes: Conjunctivae and EOM are normal. Pupils are equal, round, and reactive to light.  Neck: Normal range of motion. Neck supple.  No meningismus.  Cardiovascular: Normal rate, regular rhythm, normal heart sounds and intact distal pulses.   No murmur heard. Pulmonary/Chest: Effort normal and breath sounds normal. No respiratory distress.  Abdominal: Soft. There is no tenderness. There is no rebound and no guarding.  Musculoskeletal: Normal range of motion. She exhibits tenderness. She exhibits no edema.  Midline lower back tenderness  Neurological: She is alert and oriented to person, place, and time. No cranial nerve deficit. She exhibits normal muscle tone. Coordination normal.  5/5 strength in bilateral lower extremities. Ankle plantar and dorsiflexion intact. Great toe extension intact bilaterally. +2 DP and PT pulses. +2 patellar reflexes bilaterally. Normal gait.  Skin: Skin is warm.  Psychiatric: She has a normal mood and affect. Her behavior is normal.  Nursing note and vitals reviewed.   ED Course  Procedures  DIAGNOSTIC STUDIES: Oxygen Saturation is 100% on RA, normal by my interpretation.    COORDINATION OF CARE: 10:22 PM - Discussed plans to order an XR and diagnostic studies. Pt advised of plan for treatment and pt agrees.  Labs Review Labs Reviewed  URINALYSIS, ROUTINE W REFLEX MICROSCOPIC (NOT AT [facility]) - Abnormal; Notable for the following:    Hgb urine dipstick MODERATE (*)    All other components within normal limits  URINE MICROSCOPIC-ADD ON -  Abnormal; Notable for the following:    Squamous Epithelial / LPF FEW (*)    Bacteria, UA MANY (*)    All other components within normal limits  PREGNANCY, URINE    Imaging Review Dg Lumbar Spine Complete  10/22/2014   CLINICAL DATA:  Central lower lumbar spine pain for 3-4 months. No known injury. Pain worsened over the last week. Pain radiates down both legs, right greater than left.  EXAM: LUMBAR SPINE - COMPLETE 4+ VIEW  COMPARISON:  03/08/2013  FINDINGS: The alignment is maintained. Vertebral body heights are normal. There is no listhesis. The posterior elements are intact. Disc spaces are preserved. No fracture. Sacroiliac joints are symmetric and normal. Tubal ligation clips in the pelvis.  IMPRESSION: Negative radiographs of the lumbar spine.   Electronically Signed   By: 03/10/2013 M.D.   On: 10/22/2014 23:34     EKG Interpretation None  MDM   Final diagnoses:  Midline low back pain without sciatica   Acute on chronic low back pain without injury.  No focal weakness, numbness, tingling, no incontinence.  No fever.  Neurologically intact. No evidence of cord compression or cauda equina.  UA with blood, patient on menses.  Not consistent with renal colic.  Treat supportively for back pain with antinflammatories and muscle relaxers. Needs to establish care with PCP.  I personally performed the services described in this documentation, which was scribed in my presence. The recorded information has been reviewed and is accurate.   Brandi Octave, MD 10/23/14 [phone]

## 2015-08-07 ENCOUNTER — Emergency Department (HOSPITAL_BASED_OUTPATIENT_CLINIC_OR_DEPARTMENT_OTHER)
Admission: EM | Admit: 2015-08-07 | Discharge: 2015-08-08 | Disposition: A | Discharge status: Home / Self Care | Payer: Managed Care, Other (non HMO) | Attending: Emergency Medicine | Admitting: Emergency Medicine

## 2015-08-07 ENCOUNTER — Encounter (HOSPITAL_BASED_OUTPATIENT_CLINIC_OR_DEPARTMENT_OTHER): Payer: Self-pay | Admitting: *Deleted

## 2015-08-07 DIAGNOSIS — F329 Major depressive disorder, single episode, unspecified: Secondary | ICD-10-CM | POA: Insufficient documentation

## 2015-08-07 DIAGNOSIS — G43009 Migraine without aura, not intractable, without status migrainosus: Secondary | ICD-10-CM | POA: Diagnosis present

## 2015-08-07 NOTE — ED Notes (Signed)
Pt c/o "migraine " x 3 hrs

## 2015-08-08 ENCOUNTER — Emergency Department (HOSPITAL_BASED_OUTPATIENT_CLINIC_OR_DEPARTMENT_OTHER): Payer: Managed Care, Other (non HMO)

## 2015-08-08 ENCOUNTER — Encounter (HOSPITAL_BASED_OUTPATIENT_CLINIC_OR_DEPARTMENT_OTHER): Payer: Self-pay | Admitting: Emergency Medicine

## 2015-08-08 LAB — PREGNANCY, URINE: Preg Test, Ur: NEGATIVE

## 2015-08-08 MED ORDER — DIPHENHYDRAMINE HCL 25 MG PO CAPS
50.0000 mg | ORAL_CAPSULE | Freq: Once | ORAL | Status: AC
  Administered 2015-08-08: 50 mg via ORAL
  Filled 2015-08-08: qty 2

## 2015-08-08 MED ORDER — METOCLOPRAMIDE HCL 5 MG/ML IJ SOLN
10.0000 mg | Freq: Once | INTRAMUSCULAR | Status: AC
  Administered 2015-08-08: 10 mg via INTRAMUSCULAR
  Filled 2015-08-08: qty 2

## 2015-08-08 MED ORDER — METOCLOPRAMIDE HCL 10 MG PO TABS: 10.0000 mg | ORAL_TABLET | Freq: Three times a day (TID) | ORAL | Status: DC | PRN

## 2015-08-08 MED ORDER — KETOROLAC TROMETHAMINE 60 MG/2ML IM SOLN
60.0000 mg | Freq: Once | INTRAMUSCULAR | Status: AC
  Administered 2015-08-08: 60 mg via INTRAMUSCULAR
  Filled 2015-08-08: qty 2

## 2015-08-08 NOTE — ED Notes (Signed)
C/o ha x 1 week  Worse last 7 hours

## 2015-08-08 NOTE — Discharge Instructions (Signed)

## 2015-08-08 NOTE — ED Provider Notes (Signed)
CSN: [id number]     Arrival date & time 08/07/15  2230 History  By signing my name below, I, [facility], attest that this documentation has been prepared under the direction and in the presence of Windell Musson, MD. Electronically Signed: Randell Patient, ED Scribe. 08/08/2015. 12:30 AM.     Chief Complaint  Patient presents with  . Migraine   Patient is a 35 y.o. female presenting with migraines. The history is provided by the patient. No language interpreter was used.  Migraine This is a chronic problem. The current episode started 12 to 24 hours ago. The problem occurs constantly. The problem has not changed since onset.Associated symptoms include headaches. Pertinent negatives include no chest pain, no abdominal pain and no shortness of breath. Nothing aggravates the symptoms. Nothing relieves the symptoms. The treatment provided no relief.   HPI Comments: Zehava Parrish is a 35 y.o. female with an hx of migraines who presents to the Emergency Department complaining of constant, gradually worsening HA behind her bilateral eyes ongoing for the past 5 days, worse in the past 3 hours. She describes the HA as dull and throbbing and as sharp only with movement. She reports associated photophobia. She has taken Execdrin Migraine without relief. Per pt, she notes similar HAs in the past that resolved with at home treatment and states that current HA is less severe. Denies seeing a specialist for this migraines in the past due to a lack of insurance and money. Denies any other symptoms currently  Past Medical History  Diagnosis Date  . Arthritis     autoimmune rheumatoid arthritis  . History of chicken pox   . Depression   . GERD (gastroesophageal reflux disease)   . Fibromyalgia    Past Surgical History  Procedure Laterality Date  . Cesarean section  2009  . Cesarean section  2012  . Tubal ligation  2012   Family History  Problem Relation Age of Onset  . Arthritis Mother      spondylitis  . Heart disease Father   . Diabetes Maternal Uncle   . Arthritis Maternal Grandmother   . Arthritis Maternal Grandfather   . Arthritis Paternal Grandmother     RA  . Arthritis Paternal Grandfather   . Hyperlipidemia Paternal Grandfather   . Hypertension Paternal Grandfather   . Stroke Paternal Grandfather   . Kidney disease Paternal Grandfather   . Heart disease Paternal Grandfather    Social History  Substance Use Topics  . Smoking status: Never Smoker   . Smokeless tobacco: Never Used  . Alcohol Use: Yes     Comment: 1-2 drinks per month   OB History    No data available     Review of Systems  Constitutional: Negative for fever.  Eyes: Positive for photophobia.  Respiratory: Negative for shortness of breath.   Cardiovascular: Negative for chest pain.  Gastrointestinal: Negative for abdominal pain.  Musculoskeletal: Negative for neck pain and neck stiffness.  Skin: Negative for rash.  Neurological: Positive for headaches. Negative for dizziness, seizures, syncope, facial asymmetry, speech difficulty and weakness.  All other systems reviewed and are negative.  Allergies  Review of patient's allergies indicates no known allergies.  Home Medications   Prior to Admission medications   Medication Sig Start Date End Date Taking? Authorizing Provider  albuterol (PROVENTIL HFA;VENTOLIN HFA) 108 (90 BASE) MCG/ACT inhaler Inhale into the lungs every 6 (six) hours as needed for wheezing or shortness of breath.    Historical Provider, MD  amitriptyline (ELAVIL) 25 MG tablet Take 1 tablet (25 mg total) by mouth at bedtime. 03/08/13   Sandford Craze, NP  beclomethasone (QVAR) 80 MCG/ACT inhaler Inhale into the lungs 2 (two) times daily.    Historical Provider, MD  cyclobenzaprine (FLEXERIL) 10 MG tablet Take 1 tablet (10 mg total) by mouth 2 (two) times daily as needed for muscle spasms. 10/22/14   [name], MD  dicyclomine (BENTYL) 20 MG tablet Take 1 tablet  (20 mg total) by mouth 2 (two) times daily. 12/11/13   Shari Upstill, PA-C  FLUOCINOLONE ACETONIDE SCALP 0.01 % OIL Apply 1 application topically 2 (two) times daily. 03/08/13   Sandford Craze, NP  HYDROcodone-acetaminophen (NORCO/VICODIN) 5-325 MG per tablet Take 1 tablet by mouth every 6 (six) hours as needed. 07/31/14   [name], MD  naproxen (NAPROSYN) 500 MG tablet Take 1 tablet (500 mg total) by mouth 2 (two) times daily. 10/22/14   [name], MD  omeprazole (PRILOSEC) 40 MG capsule Take 40 mg by mouth daily.    Historical Provider, MD  pantoprazole (PROTONIX) 40 MG tablet Take 1 tablet (40 mg total) by mouth daily. 04/21/13   Sandford Craze, NP   BP 136/92 mmHg  Pulse 81  Temp(Src) 98.6 F (37 C)  Resp 16  Ht 5\' 3"  (1.6 m)  Wt 185 lb (83.915 kg)  BMI 32.78 kg/m2  SpO2 100%  LMP 07/28/2015 Physical Exam  Constitutional: She is oriented to person, place, and time. She appears well-developed and well-nourished. No distress.  HENT:  Head: Normocephalic and atraumatic.  Mouth/Throat: Oropharynx is clear and moist and mucous membranes are normal. No oropharyngeal exudate.  Eyes: Conjunctivae and EOM are normal. Pupils are equal, round, and reactive to light.  Neck: Normal range of motion. Carotid bruit is not present.  Cardiovascular: Normal rate and regular rhythm.   Pulmonary/Chest: Effort normal and breath sounds normal. No stridor. No respiratory distress. She has no wheezes. She has no rales.  Lungs CTA bilaterally.  Abdominal: Soft. Bowel sounds are normal. There is no tenderness. There is no rebound and no guarding.  Musculoskeletal: Normal range of motion.  5/5 strength in all extremities  Neurological: She is alert and oriented to person, place, and time. She has normal reflexes. She displays normal reflexes. No cranial nerve deficit. She exhibits normal muscle tone.  CN 2-12 intact.  Skin: Skin is warm and dry.  Psychiatric: She has a normal mood and affect.  Her behavior is normal.  Nursing note and vitals reviewed.   ED Course  Procedures   DIAGNOSTIC STUDIES: Oxygen Saturation is 100% on RA, normal by my interpretation.    COORDINATION OF CARE: 12:00 AM Will order labs, Toradol, Reglan, and Benadryl. Discussed treatment plan with pt at bedside and pt agreed to plan.   Labs Review Labs Reviewed  PREGNANCY, URINE    Imaging Review No results found. I have personally reviewed and evaluated these images and lab results as part of my medical decision-making.   EKG Interpretation None      MDM   Final diagnoses:  None    Filed Vitals:   08/07/15 2236  BP: 136/92  Pulse: 81  Temp: 98.6 F (37 C)  Resp: 16   Results for orders placed or performed during the hospital encounter of 08/07/15  Pregnancy, urine  Result Value Ref Range   Preg Test, Ur NEGATIVE NEGATIVE   Ct Head Wo Contrast  08/08/2015  CLINICAL DATA:  Headaches for 1 week, worse  over 7 hours. EXAM: CT HEAD WITHOUT CONTRAST TECHNIQUE: Contiguous axial images were obtained from the base of the skull through the vertex without intravenous contrast. COMPARISON:  None. FINDINGS: Ventricles and sulci appear symmetrical. No ventricular dilatation. No mass effect or midline shift. No abnormal extra-axial fluid collections. Gray-white matter junctions are distinct. Basal cisterns are not effaced. No evidence of acute intracranial hemorrhage. No depressed skull fractures. Visualized paranasal sinuses and mastoid air cells are not opacified. IMPRESSION: No acute intracranial abnormalities. Electronically Signed   By: Burman Nieves M.D.   On: 08/08/2015 01:36   Medications  ketorolac (TORADOL) injection 60 mg (60 mg Intramuscular Given 08/08/15 0048)  metoCLOPramide (REGLAN) injection 10 mg (10 mg Intramuscular Given 08/08/15 0044)  diphenhydrAMINE (BENADRYL) capsule 50 mg (50 mg Oral Given 08/08/15 0042)   Feeling better, follow up with your headache specialist.  Strict  return precautions given  I personally performed the services described in this documentation, which was scribed in my presence. The recorded information has been reviewed and is accurate.      Cy Blamer, MD 08/08/15 [phone]

## 2015-11-24 ENCOUNTER — Encounter (HOSPITAL_BASED_OUTPATIENT_CLINIC_OR_DEPARTMENT_OTHER): Payer: Self-pay | Admitting: *Deleted

## 2015-11-24 ENCOUNTER — Emergency Department (HOSPITAL_BASED_OUTPATIENT_CLINIC_OR_DEPARTMENT_OTHER)
Admission: EM | Admit: 2015-11-24 | Discharge: 2015-11-24 | Disposition: A | Discharge status: Home / Self Care | Payer: Managed Care, Other (non HMO) | Attending: Emergency Medicine | Admitting: Emergency Medicine

## 2015-11-24 DIAGNOSIS — Y93F2 Activity, caregiving, lifting: Secondary | ICD-10-CM | POA: Diagnosis not present

## 2015-11-24 DIAGNOSIS — Y999 Unspecified external cause status: Secondary | ICD-10-CM | POA: Insufficient documentation

## 2015-11-24 DIAGNOSIS — S29012A Strain of muscle and tendon of back wall of thorax, initial encounter: Secondary | ICD-10-CM | POA: Diagnosis not present

## 2015-11-24 DIAGNOSIS — Y929 Unspecified place or not applicable: Secondary | ICD-10-CM | POA: Diagnosis not present

## 2015-11-24 DIAGNOSIS — X500XXA Overexertion from strenuous movement or load, initial encounter: Secondary | ICD-10-CM | POA: Diagnosis not present

## 2015-11-24 DIAGNOSIS — S299XXA Unspecified injury of thorax, initial encounter: Secondary | ICD-10-CM | POA: Diagnosis present

## 2015-11-24 LAB — PREGNANCY, URINE: Preg Test, Ur: NEGATIVE

## 2015-11-24 MED ORDER — IBUPROFEN 800 MG PO TABS
800.0000 mg | ORAL_TABLET | Freq: Once | ORAL | Status: AC
  Administered 2015-11-24: 800 mg via ORAL
  Filled 2015-11-24: qty 1

## 2015-11-24 MED ORDER — CYCLOBENZAPRINE HCL 10 MG PO TABS: 10.0000 mg | ORAL_TABLET | Freq: Three times a day (TID) | ORAL | 0 refills | Status: DC | PRN

## 2015-11-24 MED ORDER — IBUPROFEN 800 MG PO TABS: 800.0000 mg | ORAL_TABLET | Freq: Three times a day (TID) | ORAL | 0 refills | Status: DC | PRN

## 2015-11-24 NOTE — ED Provider Notes (Signed)
TIME SEEN: 2:15 AM  CHIEF COMPLAINT: Back pain  HPI: Pt is a 35 y.o. female with history of rheumatoid arthritis and fibromyalgia who presents emergency department with upper thoracic pain. Started several days ago after lifting heavy laundry baskets. Pain is worse with movement. Has had similar symptoms in the past and improved normally with ibuprofen. States she is here because she wanted to make sure nothing else was going on. No fevers, numbness, tingling or focal weakness. No bowel or bladder incontinence. No urinary retention. She is not a diabetic, no history of HIV or IV drug abuse. No history of cancer.  ROS: See HPI Constitutional: no fever  Eyes: no drainage  ENT: no runny nose   Cardiovascular:  no chest pain  Resp: no SOB  GI: no vomiting GU: no dysuria Integumentary: no rash  Allergy: no hives  Musculoskeletal: no leg swelling  Neurological: no slurred speech ROS otherwise negative  PAST MEDICAL HISTORY/PAST SURGICAL HISTORY:  Past Medical History:  Diagnosis Date  . Arthritis    autoimmune rheumatoid arthritis  . Depression   . Fibromyalgia   . GERD (gastroesophageal reflux disease)   . History of chicken pox     MEDICATIONS:  Prior to Admission medications   Medication Sig Start Date End Date Taking? Authorizing Provider  albuterol (PROVENTIL HFA;VENTOLIN HFA) 108 (90 BASE) MCG/ACT inhaler Inhale into the lungs every 6 (six) hours as needed for wheezing or shortness of breath.    Historical Provider, MD  amitriptyline (ELAVIL) 25 MG tablet Take 1 tablet (25 mg total) by mouth at bedtime. 03/08/13   Sandford Craze, NP  beclomethasone (QVAR) 80 MCG/ACT inhaler Inhale into the lungs 2 (two) times daily.    Historical Provider, MD  cyclobenzaprine (FLEXERIL) 10 MG tablet Take 1 tablet (10 mg total) by mouth 2 (two) times daily as needed for muscle spasms. 10/22/14   [name], MD  dicyclomine (BENTYL) 20 MG tablet Take 1 tablet (20 mg total) by mouth 2 (two)  times daily. 12/11/13   Shari Upstill, PA-C  FLUOCINOLONE ACETONIDE SCALP 0.01 % OIL Apply 1 application topically 2 (two) times daily. 03/08/13   Sandford Craze, NP  HYDROcodone-acetaminophen (NORCO/VICODIN) 5-325 MG per tablet Take 1 tablet by mouth every 6 (six) hours as needed. 07/31/14   [name], MD  metoCLOPramide (REGLAN) 10 MG tablet Take 1 tablet (10 mg total) by mouth every 8 (eight) hours as needed for nausea. 08/08/15   April Palumbo, MD  naproxen (NAPROSYN) 500 MG tablet Take 1 tablet (500 mg total) by mouth 2 (two) times daily. 10/22/14   [name], MD  omeprazole (PRILOSEC) 40 MG capsule Take 40 mg by mouth daily.    Historical Provider, MD  pantoprazole (PROTONIX) 40 MG tablet Take 1 tablet (40 mg total) by mouth daily. 04/21/13   Sandford Craze, NP    ALLERGIES:  No Known Allergies  SOCIAL HISTORY:  Social History  Substance Use Topics  . Smoking status: Never Smoker  . Smokeless tobacco: Never Used  . Alcohol use Yes     Comment: 1-2 drinks per month    FAMILY HISTORY: Family History  Problem Relation Age of Onset  . Heart disease Father   . Arthritis Mother     spondylitis  . Diabetes Maternal Uncle   . Arthritis Maternal Grandmother   . Arthritis Maternal Grandfather   . Arthritis Paternal Grandmother     RA  . Arthritis Paternal Grandfather   . Hyperlipidemia Paternal Grandfather   .  Hypertension Paternal Grandfather   . Stroke Paternal Grandfather   . Kidney disease Paternal Grandfather   . Heart disease Paternal Grandfather     EXAM: BP 116/74 (BP Location: Left Arm)   Pulse 90   Temp 98.2 F (36.8 C) (Oral)   Resp 18   Ht 5\' 3"  (1.6 m)   Wt 185 lb (83.9 kg)   SpO2 100%   BMI 32.77 kg/m  CONSTITUTIONAL: Alert and oriented and responds appropriately to questions. Well-appearing; well-nourished HEAD: Normocephalic EYES: Conjunctivae clear, PERRL ENT: normal nose; no rhinorrhea; moist mucous membranes NECK: Supple, no  meningismus, no LAD  CARD: RRR; S1 and S2 appreciated; no murmurs, no clicks, no rubs, no gallops RESP: Normal chest excursion without splinting or tachypnea; breath sounds clear and equal bilaterally; no wheezes, no rhonchi, no rales, no hypoxia or respiratory distress, speaking full sentences ABD/GI: Normal bowel sounds; non-distended; soft, non-tender, no rebound, no guarding, no peritoneal signs BACK:  The back appears normal and is Tender to palpation throughout the thoracic paraspinal musculature bilaterally without erythema, warmth, or lesions. No rash. No ecchymosis. No midline spinal tenderness or step-off or deformity. EXT: Normal ROM in all joints; non-tender to palpation; no edema; normal capillary refill; no cyanosis, no calf tenderness or swelling    SKIN: Normal color for age and race; warm; no rash NEURO: Moves all extremities equally, sensation to light touch intact diffusely, cranial nerves II through XII intact, no saddle anesthesia, 2+ deep tendon reflexes bilaterally, normal gait PSYCH: The patient's mood and manner are appropriate. Grooming and personal hygiene are appropriate.  MEDICAL DECISION MAKING: Patient here with thoracic muscle strain, spasm. Nothing to suggest fracture, dislocation. Doubt cauda equina, discitis, transverse mellitus, epidural abscess or hematoma. Neurologically intact. We'll discharge with ibuprofen, Flexeril. She states this has helped her in the past. We'll provide her with a work note. I do not feel this time she needs further emergent imaging or workup. She does have a PCP for follow-up. Discussed return precautions. She is comfortable with this plan.  At this time, I do not feel there is any life-threatening condition present. I have reviewed and discussed all results (EKG, imaging, lab, urine as appropriate), exam findings with patient/family. I have reviewed nursing notes and appropriate previous records.  I feel the patient is safe to be discharged  home without further emergent workup and can continue workup as an outpatient as needed. Discussed usual and customary return precautions. Patient/family verbalize understanding and are comfortable with this plan.  Outpatient follow-up has been provided. All questions have been answered.     Shankar Silber, DO 11/24/15 [phone]

## 2015-11-24 NOTE — ED Triage Notes (Signed)
Woke w back pain on wednessday,taking ibu without relief, pain increased w movement  Denies ua sx

## 2015-12-21 ENCOUNTER — Emergency Department (HOSPITAL_BASED_OUTPATIENT_CLINIC_OR_DEPARTMENT_OTHER)
Admission: EM | Admit: 2015-12-21 | Discharge: 2015-12-21 | Disposition: A | Discharge status: Home / Self Care | Payer: Managed Care, Other (non HMO) | Attending: Dermatology | Admitting: Dermatology

## 2015-12-21 DIAGNOSIS — Y929 Unspecified place or not applicable: Secondary | ICD-10-CM | POA: Diagnosis not present

## 2015-12-21 DIAGNOSIS — Y939 Activity, unspecified: Secondary | ICD-10-CM | POA: Diagnosis not present

## 2015-12-21 DIAGNOSIS — Z5321 Procedure and treatment not carried out due to patient leaving prior to being seen by health care provider: Secondary | ICD-10-CM | POA: Diagnosis not present

## 2015-12-21 DIAGNOSIS — W19XXXA Unspecified fall, initial encounter: Secondary | ICD-10-CM | POA: Diagnosis not present

## 2015-12-21 DIAGNOSIS — M79643 Pain in unspecified hand: Secondary | ICD-10-CM | POA: Insufficient documentation

## 2015-12-21 DIAGNOSIS — Y999 Unspecified external cause status: Secondary | ICD-10-CM | POA: Diagnosis not present

## 2015-12-21 NOTE — ED Notes (Signed)
Pt left prior to being triaged.

## 2016-11-26 ENCOUNTER — Encounter (HOSPITAL_BASED_OUTPATIENT_CLINIC_OR_DEPARTMENT_OTHER): Payer: Self-pay | Admitting: *Deleted

## 2016-11-26 ENCOUNTER — Emergency Department (HOSPITAL_BASED_OUTPATIENT_CLINIC_OR_DEPARTMENT_OTHER): Payer: Managed Care, Other (non HMO)

## 2016-11-26 ENCOUNTER — Emergency Department (HOSPITAL_BASED_OUTPATIENT_CLINIC_OR_DEPARTMENT_OTHER)
Admission: EM | Admit: 2016-11-26 | Discharge: 2016-11-27 | Disposition: A | Discharge status: Home / Self Care | Payer: Managed Care, Other (non HMO) | Attending: Emergency Medicine | Admitting: Emergency Medicine

## 2016-11-26 DIAGNOSIS — Z79899 Other long term (current) drug therapy: Secondary | ICD-10-CM | POA: Insufficient documentation

## 2016-11-26 DIAGNOSIS — J40 Bronchitis, not specified as acute or chronic: Secondary | ICD-10-CM | POA: Diagnosis not present

## 2016-11-26 DIAGNOSIS — R05 Cough: Secondary | ICD-10-CM | POA: Diagnosis present

## 2016-11-26 LAB — RAPID STREP SCREEN (MED CTR MEBANE ONLY): Streptococcus, Group A Screen (Direct): NEGATIVE

## 2016-11-26 NOTE — ED Triage Notes (Signed)
Productive cough x 3 weeks and sore throat

## 2016-11-27 MED ORDER — ALBUTEROL SULFATE HFA 108 (90 BASE) MCG/ACT IN AERS: 2.0000 | INHALATION_SPRAY | RESPIRATORY_TRACT | 0 refills | Status: DC | PRN

## 2016-11-27 MED ORDER — LORATADINE 10 MG PO TABS: 10.0000 mg | ORAL_TABLET | Freq: Every day | ORAL | 0 refills | Status: DC

## 2016-11-27 MED ORDER — ALBUTEROL SULFATE HFA 108 (90 BASE) MCG/ACT IN AERS
2.0000 | INHALATION_SPRAY | Freq: Once | RESPIRATORY_TRACT | Status: AC
  Administered 2016-11-27: 2 via RESPIRATORY_TRACT
  Filled 2016-11-27: qty 6.7

## 2016-11-27 MED ORDER — PREDNISONE 50 MG PO TABS
60.0000 mg | ORAL_TABLET | Freq: Once | ORAL | Status: AC
  Administered 2016-11-27: 60 mg via ORAL
  Filled 2016-11-27: qty 1

## 2016-11-27 MED ORDER — PREDNISONE 20 MG PO TABS
40.0000 mg | ORAL_TABLET | Freq: Every day | ORAL | 0 refills | Status: DC
Start: 2016-11-27 — End: 2018-10-23

## 2016-11-27 NOTE — ED Provider Notes (Signed)
[facility]   CSN: [id number] Arrival date & time: 11/26/16  2104     History   Chief Complaint Chief Complaint  Patient presents with  . Cough    HPI Brandi Parrish is a 36 y.o. female.  HPI  This is a 36 year old female who presents with cough. Patient reports 3 week history of upper respiratory symptoms and cough. She reports cough is productive. No fevers. She does endorse sore throat. No rhinorrhea, congestion, ear pain. She denies any fevers. She is a nonsmoker. No history of asthma.  No known sick contacts. Denies chest pain, shortness of breath, abdominal pain, nausea vomiting.  Past Medical History:  Diagnosis Date  . Arthritis    autoimmune rheumatoid arthritis  . Depression   . Fibromyalgia   . GERD (gastroesophageal reflux disease)   . History of chicken pox     Patient Active Problem List   Diagnosis Date Noted  . Acute low back pain with radicular symptoms, duration less than 6 weeks 03/08/2013  . Psoriasis 03/08/2013  . Routine general medical examination at a health care facility 09/01/2012  . Rheumatoid arthritis(714.0) 07/18/2012  . Hirsutism 07/18/2012  . GERD (gastroesophageal reflux disease) 07/18/2012  . Depression 07/18/2012  . Migraine 07/18/2012    Past Surgical History:  Procedure Laterality Date  . CESAREAN SECTION  2009  . CESAREAN SECTION  2012  . TUBAL LIGATION  2012    OB History    No data available       Home Medications    Prior to Admission medications   Medication Sig Start Date End Date Taking? Authorizing Provider  albuterol (PROVENTIL HFA;VENTOLIN HFA) 108 (90 Base) MCG/ACT inhaler Inhale 2 puffs into the lungs every 4 (four) hours as needed for wheezing or shortness of breath. 11/27/16   Enis Leatherwood, Mayer Masker, MD  ibuprofen (ADVIL,MOTRIN) 800 MG tablet Take 1 tablet (800 mg total) by mouth every 8 (eight) hours as needed for mild pain. 11/24/15   Ward, Layla Maw, DO  loratadine (CLARITIN) 10 MG  tablet Take 1 tablet (10 mg total) by mouth daily. 11/27/16   Carles Florea, Mayer Masker, MD  omeprazole (PRILOSEC) 40 MG capsule Take 40 mg by mouth daily.    [provider]  predniSONE (DELTASONE) 20 MG tablet Take 2 tablets (40 mg total) by mouth daily. 11/27/16   Gagandeep Kossman, Mayer Masker, MD    Family History Family History  Problem Relation Age of Onset  . Heart disease Father   . Arthritis Mother        spondylitis  . Diabetes Maternal Uncle   . Arthritis Maternal Grandmother   . Arthritis Maternal Grandfather   . Arthritis Paternal Grandmother        RA  . Arthritis Paternal Grandfather   . Hyperlipidemia Paternal Grandfather   . Hypertension Paternal Grandfather   . Stroke Paternal Grandfather   . Kidney disease Paternal Grandfather   . Heart disease Paternal Grandfather     Social History Social History  Substance Use Topics  . Smoking status: Never Smoker  . Smokeless tobacco: Never Used  . Alcohol use Yes     Comment: 1-2 drinks per month     Allergies   Patient has no known allergies.   Review of Systems Review of Systems  Constitutional: Negative for fever.  Respiratory: Positive for cough. Negative for shortness of breath.   Cardiovascular: Negative for chest pain.  Gastrointestinal: Negative for abdominal pain, diarrhea and vomiting.  All  other systems reviewed and are negative.    Physical Exam Updated Vital Signs BP (!) 145/83   Pulse 96   Temp 98.3 F (36.8 C) (Oral)   Resp 18   Ht 5\' 3"  (1.6 m)   Wt 86.2 kg (190 lb)   LMP 11/17/2016 (Exact Date)   SpO2 98%   BMI 33.66 kg/m   Physical Exam  Constitutional: She is oriented to person, place, and time. She appears well-developed and well-nourished. No distress.  HENT:  Head: Normocephalic and atraumatic.  Mouth/Throat: Oropharynx is clear and moist.  Hirsutism Postnasal drip noted, uvula midline, no significant erythema or tonsillar enlargement  Eyes: Pupils are equal, round, and reactive to  light.  Cardiovascular: Normal rate, regular rhythm and normal heart sounds.   Pulmonary/Chest: Effort normal. No respiratory distress. She has wheezes.  Expiratory wheeze noted  Abdominal: Soft. Bowel sounds are normal. There is no tenderness.  Neurological: She is alert and oriented to person, place, and time.  Skin: Skin is warm and dry.  Psychiatric: She has a normal mood and affect.  Nursing note and vitals reviewed.    ED Treatments / Results  Labs (all labs ordered are listed, but only abnormal results are displayed) Labs Reviewed  RAPID STREP SCREEN (NOT AT [facility])  CULTURE, GROUP A STREP [facility])    EKG  EKG Interpretation None       Radiology Dg Chest 2 View  Result Date: 11/26/2016 CLINICAL DATA:  Deep cough with drainage for 3 weeks EXAM: CHEST  2 VIEW COMPARISON:  12/07/2014 FINDINGS: The heart size and mediastinal contours are within normal limits. Both lungs are clear. The visualized skeletal structures are unremarkable. IMPRESSION: No active cardiopulmonary disease. Electronically Signed   By: 12/09/2014 M.D.   On: 11/26/2016 22:18    Procedures Procedures (including critical care time)  Medications Ordered in ED Medications  predniSONE (DELTASONE) tablet 60 mg (60 mg Oral Given 11/27/16 0109)  albuterol (PROVENTIL HFA;VENTOLIN HFA) 108 (90 Base) MCG/ACT inhaler 2 puff (2 puffs Inhalation Given 11/27/16 0114)     Initial Impression / Assessment and Plan / ED Course  I have reviewed the triage vital signs and the nursing notes.  Pertinent labs & imaging results that were available during my care of the patient were reviewed by me and considered in my medical decision making (see chart for details).    Patient reports with three-week history of cough. Also feels per respiratory symptoms. She is nontoxic. Afebrile. Chest x-ray is negative for pneumonia. She is wheezing on exam. Likely bronchitis. Seasonal allergies is also a consideration. Patient was provided  an inhaler and prednisone. Also recommend starting Claritin daily given postnasal drip and sore throat.  After history, exam, and medical workup I feel the patient has been appropriately medically screened and is safe for discharge home. Pertinent diagnoses were discussed with the patient. Patient was given return precautions.   Final Clinical Impressions(s) / ED Diagnoses   Final diagnoses:  Bronchitis    New Prescriptions Discharge Medication List as of 11/27/2016  1:18 AM    START taking these medications   Details  albuterol (PROVENTIL HFA;VENTOLIN HFA) 108 (90 Base) MCG/ACT inhaler Inhale 2 puffs into the lungs every 4 (four) hours as needed for wheezing or shortness of breath., Starting Wed 11/27/2016, Print    loratadine (CLARITIN) 10 MG tablet Take 1 tablet (10 mg total) by mouth daily., Starting Wed 11/27/2016, Print    predniSONE (DELTASONE) 20 MG tablet Take 2 tablets (  40 mg total) by mouth daily., Starting Wed 11/27/2016, Print         Devaughn Savant, Mayer Masker, MD 11/27/16 [phone]

## 2016-11-29 LAB — CULTURE, GROUP A STREP (THRC)

## 2017-02-20 ENCOUNTER — Emergency Department (HOSPITAL_BASED_OUTPATIENT_CLINIC_OR_DEPARTMENT_OTHER): Payer: Managed Care, Other (non HMO)

## 2017-02-20 ENCOUNTER — Emergency Department (HOSPITAL_BASED_OUTPATIENT_CLINIC_OR_DEPARTMENT_OTHER)
Admission: EM | Admit: 2017-02-20 | Discharge: 2017-02-21 | Disposition: A | Discharge status: Home / Self Care | Payer: Managed Care, Other (non HMO) | Attending: Emergency Medicine | Admitting: Emergency Medicine

## 2017-02-20 ENCOUNTER — Encounter (HOSPITAL_BASED_OUTPATIENT_CLINIC_OR_DEPARTMENT_OTHER): Payer: Self-pay | Admitting: *Deleted

## 2017-02-20 ENCOUNTER — Other Ambulatory Visit: Payer: Self-pay

## 2017-02-20 DIAGNOSIS — M79661 Pain in right lower leg: Secondary | ICD-10-CM | POA: Insufficient documentation

## 2017-02-20 DIAGNOSIS — R109 Unspecified abdominal pain: Secondary | ICD-10-CM | POA: Insufficient documentation

## 2017-02-20 DIAGNOSIS — Z79899 Other long term (current) drug therapy: Secondary | ICD-10-CM | POA: Diagnosis not present

## 2017-02-20 DIAGNOSIS — L680 Hirsutism: Secondary | ICD-10-CM

## 2017-02-20 DIAGNOSIS — M791 Myalgia, unspecified site: Secondary | ICD-10-CM

## 2017-02-20 DIAGNOSIS — M79662 Pain in left lower leg: Secondary | ICD-10-CM | POA: Diagnosis present

## 2017-02-20 LAB — BASIC METABOLIC PANEL
Anion gap: 6 (ref 5–15)
BUN: 11 mg/dL (ref 6–20)
CALCIUM: 9 mg/dL (ref 8.9–10.3)
CO2: 26 mmol/L (ref 22–32)
Chloride: 105 mmol/L (ref 101–111)
Creatinine, Ser: 0.73 mg/dL (ref 0.44–1.00)
GFR calc Af Amer: >60 mL/min (ref >60)
GFR calc non Af Amer: >60 mL/min (ref >60)
Glucose, Bld: 121 mg/dL — ABNORMAL HIGH (ref 65–99)
Potassium: 3.9 mmol/L (ref 3.5–5.1)
SODIUM: 137 mmol/L (ref 135–145)

## 2017-02-20 LAB — CBC WITH DIFFERENTIAL/PLATELET
BASOS PCT: 0 %
Basophils Absolute: 0 10*3/uL (ref 0.0–0.1)
EOS ABS: 0 10*3/uL (ref 0.0–0.7)
Eosinophils Relative: 0 %
HCT: 36.5 % (ref 36.0–46.0)
Hemoglobin: 12 g/dL (ref 12.0–15.0)
Lymphocytes Relative: 40 %
Lymphs Abs: 4 10*3/uL (ref 0.7–4.0)
MCH: 30.5 pg (ref 26.0–34.0)
MCHC: 32.9 g/dL (ref 30.0–36.0)
MCV: 92.6 fL (ref 78.0–100.0)
MONO ABS: 0.7 10*3/uL (ref 0.1–1.0)
MONOS PCT: 7 %
NEUTROS PCT: 53 %
Neutro Abs: 5.3 10*3/uL (ref 1.7–7.7)
PLATELETS: 297 10*3/uL (ref 150–400)
RBC: 3.94 MIL/uL (ref 3.87–5.11)
RDW: 13.8 % (ref 11.5–15.5)
WBC: 10.1 10*3/uL (ref 4.0–10.5)

## 2017-02-20 LAB — CK: CK TOTAL: 51 U/L (ref 38–234)

## 2017-02-20 LAB — URINALYSIS, ROUTINE W REFLEX MICROSCOPIC
BILIRUBIN URINE: NEGATIVE
Glucose, UA: NEGATIVE mg/dL
Hgb urine dipstick: NEGATIVE
Ketones, ur: NEGATIVE mg/dL
Leukocytes, UA: NEGATIVE
Nitrite: NEGATIVE
PROTEIN: NEGATIVE mg/dL
Specific Gravity, Urine: 1.02 (ref 1.005–1.030)
pH: 7 (ref 5.0–8.0)

## 2017-02-20 LAB — PREGNANCY, URINE: Preg Test, Ur: NEGATIVE

## 2017-02-20 MED ORDER — KETOROLAC TROMETHAMINE 15 MG/ML IJ SOLN
15.0000 mg | Freq: Once | INTRAMUSCULAR | Status: AC
  Administered 2017-02-20: 15 mg via INTRAVENOUS
  Filled 2017-02-20: qty 1

## 2017-02-20 MED ORDER — KETOROLAC TROMETHAMINE 60 MG/2ML IM SOLN
15.0000 mg | Freq: Once | INTRAMUSCULAR | Status: DC
  Filled 2017-02-20: qty 2

## 2017-02-20 MED ORDER — IOPAMIDOL (ISOVUE-300) INJECTION 61%
100.0000 mL | Freq: Once | INTRAVENOUS | Status: AC | PRN
  Administered 2017-02-20: 100 mL via INTRAVENOUS

## 2017-02-20 MED ORDER — HYDROCODONE-ACETAMINOPHEN 5-325 MG PO TABS
2.0000 | ORAL_TABLET | Freq: Once | ORAL | Status: AC
  Administered 2017-02-21: 2 via ORAL
  Filled 2017-02-20: qty 2

## 2017-02-20 MED ORDER — SODIUM CHLORIDE 0.9 % IV BOLUS (SEPSIS)
1000.0000 mL | Freq: Once | INTRAVENOUS | Status: AC
  Administered 2017-02-20: 1000 mL via INTRAVENOUS

## 2017-02-20 MED ORDER — MORPHINE SULFATE (PF) 4 MG/ML IV SOLN
4.0000 mg | Freq: Once | INTRAVENOUS | Status: AC
  Administered 2017-02-20: 4 mg via INTRAVENOUS
  Filled 2017-02-20: qty 1

## 2017-02-20 NOTE — ED Provider Notes (Signed)
[facility] EMERGENCY DEPARTMENT Provider Note   CSN: [id number] Arrival date & time: 02/20/17  2040     History   Chief Complaint Chief Complaint  Patient presents with  . Leg Pain    HPI Brandi Parrish is a 36 y.o. female.      36 yo F with PMHx as below here with bilateral leg pain and cramping. Pt reports taht for the past 3-4 days she has had progressively worsening bilateral leg pain and cramping. No known triggers. Pt has h/o fibromyalgia but has not had such severe pain in her legs before. She has associated mild abdominal pain, nausea, also some cramping in her upper extremities. Denies any neck pain. No recent trauma. No numbness or weakness. No specific sick contacts. Pain comes and goes randomly but is worse with palpation and movement. No alleviating factors.  Of note, pt also endorses significant hirsutism since delivery of her child years ago, has not been worked up per her report. She is also having irregular periods.  Past Medical History:  Diagnosis Date  . Arthritis    autoimmune rheumatoid arthritis  . Depression   . Fibromyalgia   . GERD (gastroesophageal reflux disease)   . History of chicken pox     Patient Active Problem List   Diagnosis Date Noted  . Acute low back pain with radicular symptoms, duration less than 6 weeks 03/08/2013  . Psoriasis 03/08/2013  . Routine general medical examination at a health care facility 09/01/2012  . Rheumatoid arthritis(714.0) 07/18/2012  . Hirsutism 07/18/2012  . GERD (gastroesophageal reflux disease) 07/18/2012  . Depression 07/18/2012  . Migraine 07/18/2012    Past Surgical History:  Procedure Laterality Date  . CESAREAN SECTION  2009  . CESAREAN SECTION  2012  . TUBAL LIGATION  2012    OB History    No data available       Home Medications    Prior to Admission medications   Medication Sig Start Date End Date Taking? Authorizing Provider  albuterol (PROVENTIL HFA;VENTOLIN HFA)  108 (90 Base) MCG/ACT inhaler Inhale 2 puffs into the lungs every 4 (four) hours as needed for wheezing or shortness of breath. 11/27/16  Yes Horton, Mayer Masker, MD  ibuprofen (ADVIL,MOTRIN) 800 MG tablet Take 1 tablet (800 mg total) by mouth every 8 (eight) hours as needed for mild pain. 11/24/15  Yes Ward, Layla Maw, DO  loratadine (CLARITIN) 10 MG tablet Take 1 tablet (10 mg total) by mouth daily. 11/27/16  Yes Horton, Mayer Masker, MD  omeprazole (PRILOSEC) 40 MG capsule Take 40 mg by mouth daily.   Yes [provider]  Prenatal Vit-Fe Fumarate-FA (PRENATAL VITAMIN PO) Take by mouth.   Yes [provider]  HYDROcodone-acetaminophen (NORCO/VICODIN) 5-325 MG tablet Take 1-2 tablets by mouth every 6 (six) hours as needed for severe pain. 02/21/17   Shaune Pollack, MD  naproxen (NAPROSYN) 500 MG tablet Take 1 tablet (500 mg total) by mouth 2 (two) times daily as needed for up to 7 days for moderate pain. 02/21/17 02/28/17  Shaune Pollack, MD  predniSONE (DELTASONE) 20 MG tablet Take 2 tablets (40 mg total) by mouth daily. 11/27/16   Horton, Mayer Masker, MD    Family History Family History  Problem Relation Age of Onset  . Heart disease Father   . Arthritis Mother        spondylitis  . Diabetes Maternal Uncle   . Arthritis Maternal Grandmother   . Arthritis Maternal Grandfather   .  Arthritis Paternal Grandmother        RA  . Arthritis Paternal Grandfather   . Hyperlipidemia Paternal Grandfather   . Hypertension Paternal Grandfather   . Stroke Paternal Grandfather   . Kidney disease Paternal Grandfather   . Heart disease Paternal Grandfather     Social History Social History   Tobacco Use  . Smoking status: Never Smoker  . Smokeless tobacco: Never Used  Substance Use Topics  . Alcohol use: Yes    Comment: 1-2 drinks per month  . Drug use: No     Allergies   Patient has no known allergies.   Review of Systems Review of Systems  Constitutional: Positive for  fatigue.  Gastrointestinal: Positive for abdominal pain and nausea.  Musculoskeletal: Positive for arthralgias and myalgias.  Neurological: Positive for weakness.  All other systems reviewed and are negative.    Physical Exam Updated Vital Signs BP 119/76   Pulse 84   Temp 98.2 F (36.8 C) (Oral)   Resp 16   Ht 5\' 3"  (1.6 m)   Wt 90.7 kg (200 lb)   LMP 02/04/2017   SpO2 97%   BMI 35.43 kg/m   Physical Exam  Constitutional: She is oriented to person, place, and time. She appears well-developed and well-nourished. No distress.  HENT:  Head: Normocephalic and atraumatic.  Marked hirsutism  Eyes: Conjunctivae are normal.  Neck: Neck supple.  Cardiovascular: Normal rate, regular rhythm and normal heart sounds. Exam reveals no friction rub.  No murmur heard. 2+ DP/PT pulses  Pulmonary/Chest: Effort normal and breath sounds normal. No respiratory distress. She has no wheezes. She has no rales.  Abdominal: Soft. She exhibits no distension. There is tenderness (diffuse lower abdominal TTP). There is no rebound and no guarding.  Musculoskeletal: She exhibits no edema.  Diffuse muscular TTP bilateral LE; no erythema; no warmth  Neurological: She is alert and oriented to person, place, and time. She exhibits normal muscle tone.  Strength 5/5 proximally and distally throughout UE and LE, normal sensation to light touch  Skin: Skin is warm. Capillary refill takes less than 2 seconds.  Psychiatric: She has a normal mood and affect.  Nursing note and vitals reviewed.    ED Treatments / Results  Labs (all labs ordered are listed, but only abnormal results are displayed) Labs Reviewed  BASIC METABOLIC PANEL - Abnormal; Notable for the following components:      Result Value   Glucose, Bld 121 (*)    All other components within normal limits  URINALYSIS, ROUTINE W REFLEX MICROSCOPIC - Abnormal; Notable for the following components:   APPearance CLOUDY (*)    All other components  within normal limits  CBC WITH DIFFERENTIAL/PLATELET  CK  PREGNANCY, URINE    EKG  EKG Interpretation None       Radiology Ct Abdomen Pelvis W Contrast  Result Date: 02/20/2017 CLINICAL DATA:  Pain from the hips to the knees EXAM: CT ABDOMEN AND PELVIS WITH CONTRAST TECHNIQUE: Multidetector CT imaging of the abdomen and pelvis was performed using the standard protocol following bolus administration of intravenous contrast. CONTRAST:  100mL ISOVUE-300 IOPAMIDOL (ISOVUE-300) INJECTION 61% COMPARISON:  None. FINDINGS: Lower chest: 3 mm right lower lobe pulmonary nodule, series 4, image number 5. No consolidation or pleural effusion. Normal heart size. Hepatobiliary: No focal liver abnormality is seen. No gallstones, gallbladder wall thickening, or biliary dilatation. Pancreas: Unremarkable. No pancreatic ductal dilatation or surrounding inflammatory changes. Spleen: Normal in size without focal abnormality. Adrenals/Urinary Tract:  Adrenal glands are unremarkable. Kidneys are normal, without renal calculi, focal lesion, or hydronephrosis. Bladder is unremarkable. Stomach/Bowel: Stomach is within normal limits. Appendix appears normal. No evidence of bowel wall thickening, distention, or inflammatory changes. Vascular/Lymphatic: No significant vascular findings are present. No enlarged abdominal or pelvic lymph nodes. Reproductive: Uterus and bilateral adnexa are unremarkable. Small amount of air in the vagina. Other: Negative for free air or free fluid. Small fat in the umbilicus. Musculoskeletal: No acute or significant osseous findings. IMPRESSION: No CT evidence for acute intra-abdominal or pelvic abnormality. Electronically Signed   By: Jasmine Pang M.D.   On: 02/20/2017 23:56    Procedures Procedures (including critical care time)  Medications Ordered in ED Medications  sodium chloride 0.9 % bolus 1,000 mL (0 mLs Intravenous Stopped 02/21/17 0019)  morphine 4 MG/ML injection 4 mg (4 mg  Intravenous Given 02/20/17 2245)  ketorolac (TORADOL) 15 MG/ML injection 15 mg (15 mg Intravenous Given 02/20/17 2251)  iopamidol (ISOVUE-300) 61 % injection 100 mL (100 mLs Intravenous Contrast Given 02/20/17 2328)  HYDROcodone-acetaminophen (NORCO/VICODIN) 5-325 MG per tablet 2 tablet (2 tablets Oral Given 02/21/17 0019)     Initial Impression / Assessment and Plan / ED Course  I have reviewed the triage vital signs and the nursing notes.  Pertinent labs & imaging results that were available during my care of the patient were reviewed by me and considered in my medical decision making (see chart for details).     36 yo F with PMHx as above here with mild abdominal pain, bilateral leg cramping. DDx is broad, includes fibromyalgia, myalgias 2/2 viral illness, possible referred pain from intra-abdominal or lumbar etiology given her abdominal TTP. Of note, pt has marked hirsutism with recent irregular periods - must always consider ovarian pathology given her TTP on exam. Will check CT, labs, re-assess.  Labs, imaging are fortunately negative. Pt feels improved in ED. Will treat as possible FM or viral myalgias and d/c with outpt OBGYN follow-up for her hirsutism, as well as PCP f/u. Return precautions given.  Final Clinical Impressions(s) / ED Diagnoses   Final diagnoses:  Myalgia  Hirsutism    ED Discharge Orders        Ordered    naproxen (NAPROSYN) 500 MG tablet  2 times daily PRN     02/21/17 0011    HYDROcodone-acetaminophen (NORCO/VICODIN) 5-325 MG tablet  Every 6 hours PRN     02/21/17 0011       Shaune Pollack, MD 02/21/17 0154

## 2017-02-20 NOTE — ED Triage Notes (Signed)
Both legs, hips and knees have been hurting since yesterday am. No fever. No rash.

## 2017-02-21 MED ORDER — NAPROXEN 500 MG PO TABS: 500.0000 mg | ORAL_TABLET | Freq: Two times a day (BID) | ORAL | 0 refills | Status: AC | PRN

## 2017-02-21 MED ORDER — HYDROCODONE-ACETAMINOPHEN 5-325 MG PO TABS: 1.0000 | ORAL_TABLET | Freq: Four times a day (QID) | ORAL | 0 refills | Status: DC | PRN

## 2017-04-12 DIAGNOSIS — E349 Endocrine disorder, unspecified: Secondary | ICD-10-CM | POA: Insufficient documentation

## 2017-06-02 DIAGNOSIS — E282 Polycystic ovarian syndrome: Secondary | ICD-10-CM | POA: Insufficient documentation

## 2017-08-12 ENCOUNTER — Encounter (HOSPITAL_BASED_OUTPATIENT_CLINIC_OR_DEPARTMENT_OTHER): Payer: Self-pay | Admitting: *Deleted

## 2017-08-12 ENCOUNTER — Other Ambulatory Visit: Payer: Self-pay

## 2017-08-12 ENCOUNTER — Emergency Department (HOSPITAL_BASED_OUTPATIENT_CLINIC_OR_DEPARTMENT_OTHER)
Admission: EM | Admit: 2017-08-12 | Discharge: 2017-08-12 | Disposition: A | Discharge status: Home / Self Care | Payer: Managed Care, Other (non HMO) | Attending: Emergency Medicine | Admitting: Emergency Medicine

## 2017-08-12 DIAGNOSIS — Z79899 Other long term (current) drug therapy: Secondary | ICD-10-CM | POA: Diagnosis not present

## 2017-08-12 DIAGNOSIS — F329 Major depressive disorder, single episode, unspecified: Secondary | ICD-10-CM | POA: Diagnosis not present

## 2017-08-12 DIAGNOSIS — N926 Irregular menstruation, unspecified: Secondary | ICD-10-CM | POA: Diagnosis present

## 2017-08-12 DIAGNOSIS — N946 Dysmenorrhea, unspecified: Secondary | ICD-10-CM | POA: Diagnosis not present

## 2017-08-12 LAB — HCG, QUANTITATIVE, PREGNANCY

## 2017-08-12 LAB — CBC WITH DIFFERENTIAL/PLATELET
BASOS ABS: 0 10*3/uL (ref 0.0–0.1)
Basophils Relative: 0 %
EOS ABS: 0 10*3/uL (ref 0.0–0.7)
EOS PCT: 0 %
HCT: 38.1 % (ref 36.0–46.0)
Hemoglobin: 13 g/dL (ref 12.0–15.0)
Lymphocytes Relative: 31 %
Lymphs Abs: 2.5 10*3/uL (ref 0.7–4.0)
MCH: 31.3 pg (ref 26.0–34.0)
MCHC: 34.1 g/dL (ref 30.0–36.0)
MCV: 91.6 fL (ref 78.0–100.0)
Monocytes Absolute: 0.5 10*3/uL (ref 0.1–1.0)
Monocytes Relative: 7 %
NEUTROS PCT: 62 %
Neutro Abs: 5 10*3/uL (ref 1.7–7.7)
PLATELETS: 321 10*3/uL (ref 150–400)
RBC: 4.16 MIL/uL (ref 3.87–5.11)
RDW: 13.7 % (ref 11.5–15.5)
WBC: 8.1 10*3/uL (ref 4.0–10.5)

## 2017-08-12 LAB — BASIC METABOLIC PANEL
ANION GAP: 9 (ref 5–15)
BUN: 11 mg/dL (ref 6–20)
CO2: 22 mmol/L (ref 22–32)
Calcium: 9 mg/dL (ref 8.9–10.3)
Chloride: 105 mmol/L (ref 101–111)
Creatinine, Ser: 0.71 mg/dL (ref 0.44–1.00)
Glucose, Bld: 87 mg/dL (ref 65–99)
POTASSIUM: 4.4 mmol/L (ref 3.5–5.1)
SODIUM: 136 mmol/L (ref 135–145)

## 2017-08-12 MED ORDER — KETOROLAC TROMETHAMINE 30 MG/ML IJ SOLN
30.0000 mg | Freq: Once | INTRAMUSCULAR | Status: AC
  Administered 2017-08-12: 30 mg via INTRAVENOUS
  Filled 2017-08-12: qty 1

## 2017-08-12 MED ORDER — SODIUM CHLORIDE 0.9 % IV BOLUS
1000.0000 mL | Freq: Once | INTRAVENOUS | Status: AC
  Administered 2017-08-12: 1000 mL via INTRAVENOUS

## 2017-08-12 MED ORDER — IBUPROFEN 800 MG PO TABS: 800.0000 mg | ORAL_TABLET | Freq: Three times a day (TID) | ORAL | 0 refills | Status: DC | PRN

## 2017-08-12 NOTE — ED Notes (Signed)
Discharge instructions and prescription reviewed - voiced understanding.  

## 2017-08-12 NOTE — ED Triage Notes (Signed)
Menstrual cramps. She took Midol x 2 an hour ago.

## 2017-08-12 NOTE — Discharge Instructions (Addendum)
Your evaluated in the emergency department for heavy vaginal bleeding.  Your blood count showed that you are not anemic.  You should take ibuprofen 3 times a day as needed for pain.  Keep well-hydrated.  Follow-up with your doctor and return if any worsening symptoms.

## 2017-08-12 NOTE — ED Provider Notes (Signed)
[facility] EMERGENCY DEPARTMENT Provider Note   CSN: [id number] Arrival date & time: 08/12/17  1532     History   Chief Complaint Chief Complaint  Patient presents with  . Dysmenorrhea    HPI Brandi Parrish is a 37 y.o. female.  She states she started her period today and is having more painful cramps radiating through to her back and down her legs and heavier bleeding than usual.  She states she is soaking through a pad every 2 hours.  She states her periods are regular as well as to about 7 days ago but otherwise seems to be her period.  There is been no fever.  Started a new medication called Ozempic by her endocrinologist that is supposed to help with weight loss.  She is told she probably has polycystic ovarian disease but has not had an ultrasound to prove that.  She had 2 surgeries C-sections.  She took Midol with minimal relief. The history is provided by the patient.  Vaginal Bleeding  Primary symptoms include vaginal bleeding.  Primary symptoms include no dysuria. There has been no fever. This is a new problem. The current episode started 6 to 12 hours ago. The problem occurs constantly. The problem has not changed since onset.She is not pregnant. She has not missed her period. LMP: last period about 5 weeks ago. The patient's menstrual history has been irregular. The discharge was bloody. Associated symptoms include abdominal pain. Pertinent negatives include no diarrhea. She has tried NSAIDs for the symptoms. The treatment provided mild relief.    Past Medical History:  Diagnosis Date  . Arthritis    autoimmune rheumatoid arthritis  . Depression   . Fibromyalgia   . GERD (gastroesophageal reflux disease)   . History of chicken pox     Patient Active Problem List   Diagnosis Date Noted  . Acute low back pain with radicular symptoms, duration less than 6 weeks 03/08/2013  . Psoriasis 03/08/2013  . Routine general medical examination at a health care  facility 09/01/2012  . Rheumatoid arthritis(714.0) 07/18/2012  . Hirsutism 07/18/2012  . GERD (gastroesophageal reflux disease) 07/18/2012  . Depression 07/18/2012  . Migraine 07/18/2012    Past Surgical History:  Procedure Laterality Date  . CESAREAN SECTION  2009  . CESAREAN SECTION  2012  . TUBAL LIGATION  2012     OB History   None      Home Medications    Prior to Admission medications   Medication Sig Start Date End Date Taking? Authorizing Provider  albuterol (PROVENTIL HFA;VENTOLIN HFA) 108 (90 Base) MCG/ACT inhaler Inhale 2 puffs into the lungs every 4 (four) hours as needed for wheezing or shortness of breath. 11/27/16   Horton, Mayer Masker, MD  HYDROcodone-acetaminophen (NORCO/VICODIN) 5-325 MG tablet Take 1-2 tablets by mouth every 6 (six) hours as needed for severe pain. 02/21/17   Shaune Pollack, MD  ibuprofen (ADVIL,MOTRIN) 800 MG tablet Take 1 tablet (800 mg total) by mouth every 8 (eight) hours as needed for mild pain. 11/24/15   Ward, Layla Maw, DO  loratadine (CLARITIN) 10 MG tablet Take 1 tablet (10 mg total) by mouth daily. 11/27/16   Horton, Mayer Masker, MD  omeprazole (PRILOSEC) 40 MG capsule Take 40 mg by mouth daily.    [provider]  predniSONE (DELTASONE) 20 MG tablet Take 2 tablets (40 mg total) by mouth daily. 11/27/16   Horton, Mayer Masker, MD  Prenatal Vit-Fe Fumarate-FA (PRENATAL VITAMIN PO) Take by mouth.  [provider]    Family History Family History  Problem Relation Age of Onset  . Heart disease Father   . Arthritis Mother        spondylitis  . Diabetes Maternal Uncle   . Arthritis Maternal Grandmother   . Arthritis Maternal Grandfather   . Arthritis Paternal Grandmother        RA  . Arthritis Paternal Grandfather   . Hyperlipidemia Paternal Grandfather   . Hypertension Paternal Grandfather   . Stroke Paternal Grandfather   . Kidney disease Paternal Grandfather   . Heart disease Paternal Grandfather     Social  History Social History   Tobacco Use  . Smoking status: Never Smoker  . Smokeless tobacco: Never Used  Substance Use Topics  . Alcohol use: Yes    Comment: 1-2 drinks per month  . Drug use: No     Allergies   Patient has no known allergies.   Review of Systems Review of Systems  Constitutional: Negative for fever.  HENT: Negative for sore throat.   Eyes: Negative for visual disturbance.  Respiratory: Negative for shortness of breath.   Cardiovascular: Negative for chest pain.  Gastrointestinal: Positive for abdominal pain. Negative for diarrhea.  Genitourinary: Positive for vaginal bleeding. Negative for dysuria.  Musculoskeletal: Positive for back pain. Negative for neck pain.  Skin: Negative for rash.  Neurological: Negative for syncope.     Physical Exam Updated Vital Signs BP (!) 123/92   Pulse 100   Temp 98.2 F (36.8 C) (Oral)   Resp 18   Ht 5\' 3"  (1.6 m)   Wt 90.7 kg (200 lb)   LMP 08/12/2017   SpO2 99%   BMI 35.43 kg/m   Physical Exam  Constitutional: She appears well-developed and well-nourished. No distress.  HENT:  Head: Normocephalic and atraumatic.  Eyes: Conjunctivae are normal.  Neck: Neck supple.  Cardiovascular: Normal rate and regular rhythm.  No murmur heard. Pulmonary/Chest: Effort normal and breath sounds normal. No respiratory distress.  Abdominal: Soft. She exhibits no mass. There is tenderness (suprapubic). There is no rebound and no guarding.  Musculoskeletal: She exhibits no edema, tenderness or deformity.  Neurological: She is alert.  Skin: Skin is warm and dry. Capillary refill takes less than 2 seconds.  hirsutism  Psychiatric: She has a normal mood and affect.  Nursing note and vitals reviewed.    ED Treatments / Results  Labs (all labs ordered are listed, but only abnormal results are displayed) Labs Reviewed  CBC WITH DIFFERENTIAL/PLATELET  BASIC METABOLIC PANEL  HCG, QUANTITATIVE, PREGNANCY     EKG None  Radiology No results found.  Procedures Procedures (including critical care time)  Medications Ordered in ED Medications  sodium chloride 0.9 % bolus 1,000 mL (has no administration in time range)  ketorolac (TORADOL) 30 MG/ML injection 30 mg (has no administration in time range)     Initial Impression / Assessment and Plan / ED Course  I have reviewed the triage vital signs and the nursing notes.  Pertinent labs & imaging results that were available during my care of the patient were reviewed by me and considered in my medical decision making (see chart for details).  Clinical Course as of Aug 14 943  Tue Aug 12, 2017  2336 Patient feeling better after the Toradol.  Her pregnancy test is still not result though the patient states she is not worried at all that the problem.  We did prescribe her some ibuprofen and have her  follow-up with her PCP for further work-up.   [MB]    Clinical Course User Index [MB] Terrilee Files, MD    Final Clinical Impressions(s) / ED Diagnoses   Final diagnoses:  Dysmenorrhea    ED Discharge Orders        Ordered    ibuprofen (ADVIL,MOTRIN) 800 MG tablet  Every 8 hours PRN     08/12/17 1940       Terrilee Files, MD 08/13/17 [phone]

## 2018-03-15 ENCOUNTER — Emergency Department (HOSPITAL_BASED_OUTPATIENT_CLINIC_OR_DEPARTMENT_OTHER): Payer: Managed Care, Other (non HMO)

## 2018-03-15 ENCOUNTER — Other Ambulatory Visit: Payer: Self-pay

## 2018-03-15 ENCOUNTER — Emergency Department (HOSPITAL_BASED_OUTPATIENT_CLINIC_OR_DEPARTMENT_OTHER)
Admission: EM | Admit: 2018-03-15 | Discharge: 2018-03-15 | Disposition: A | Discharge status: Home / Self Care | Payer: Managed Care, Other (non HMO) | Attending: Emergency Medicine | Admitting: Emergency Medicine

## 2018-03-15 ENCOUNTER — Encounter (HOSPITAL_BASED_OUTPATIENT_CLINIC_OR_DEPARTMENT_OTHER): Payer: Self-pay | Admitting: *Deleted

## 2018-03-15 DIAGNOSIS — R05 Cough: Secondary | ICD-10-CM | POA: Diagnosis present

## 2018-03-15 DIAGNOSIS — Z7984 Long term (current) use of oral hypoglycemic drugs: Secondary | ICD-10-CM | POA: Diagnosis not present

## 2018-03-15 DIAGNOSIS — Z79899 Other long term (current) drug therapy: Secondary | ICD-10-CM | POA: Diagnosis not present

## 2018-03-15 DIAGNOSIS — J111 Influenza due to unidentified influenza virus with other respiratory manifestations: Secondary | ICD-10-CM | POA: Diagnosis not present

## 2018-03-15 DIAGNOSIS — F329 Major depressive disorder, single episode, unspecified: Secondary | ICD-10-CM | POA: Insufficient documentation

## 2018-03-15 DIAGNOSIS — R69 Illness, unspecified: Secondary | ICD-10-CM

## 2018-03-15 MED ORDER — IBUPROFEN 800 MG PO TABS
800.0000 mg | ORAL_TABLET | Freq: Once | ORAL | Status: AC
  Administered 2018-03-15: 800 mg via ORAL
  Filled 2018-03-15: qty 1

## 2018-03-15 NOTE — ED Provider Notes (Signed)
[facility] EMERGENCY DEPARTMENT Provider Note   CSN: [id number] Arrival date & time: 03/15/18  4166     History   Chief Complaint Chief Complaint  Patient presents with  . Fever    HPI Brandi Parrish is a 37 y.o. female.  Patient is a 37 year old female with past medical history of fibromyalgia, depression, GERD, PCO S.  She presents today for evaluation of cough and fever.  She reports a 2-day history of cough, then began with fever approximately 4 hours prior to presentation.  She reports body aches and as if she is "been hit by a truck".  She has not taken anything for her symptoms.  She denies any chest pain or difficulty breathing.  Denies productive cough.  The history is provided by the patient.  Fever   This is a new problem. Episode onset: 4 hours ago. The problem occurs constantly. The problem has been rapidly worsening. The maximum temperature noted was 100 to 100.9 F. Associated symptoms include cough. Pertinent negatives include no chest pain, no diarrhea, no vomiting and no sore throat. She has tried nothing for the symptoms.    Past Medical History:  Diagnosis Date  . Arthritis    autoimmune rheumatoid arthritis  . Depression   . Fibromyalgia   . GERD (gastroesophageal reflux disease)   . History of chicken pox     Patient Active Problem List   Diagnosis Date Noted  . Acute low back pain with radicular symptoms, duration less than 6 weeks 03/08/2013  . Psoriasis 03/08/2013  . Routine general medical examination at a health care facility 09/01/2012  . Rheumatoid arthritis(714.0) 07/18/2012  . Hirsutism 07/18/2012  . GERD (gastroesophageal reflux disease) 07/18/2012  . Depression 07/18/2012  . Migraine 07/18/2012    Past Surgical History:  Procedure Laterality Date  . CESAREAN SECTION  2009  . CESAREAN SECTION  2012  . TUBAL LIGATION  2012     OB History   No obstetric history on file.      Home Medications    Prior to Admission  medications   Medication Sig Start Date End Date Taking? Authorizing Provider  esomeprazole (NEXIUM) 40 MG capsule Take 40 mg by mouth daily at 12 noon.   Yes [provider]  gabapentin (NEURONTIN) 300 MG capsule Take 300 mg by mouth 4 (four) times daily.   Yes [provider]  metFORMIN (GLUCOPHAGE-XR) 500 MG 24 hr tablet Take 500 mg by mouth daily with breakfast.   Yes [provider]  Semaglutide (OZEMPIC, 0.25 OR 0.5 MG/DOSE, [place]) Inject into the skin.   Yes [provider]  spironolactone (ALDACTONE) 100 MG tablet Take 100 mg by mouth daily.   Yes [provider]  albuterol (PROVENTIL HFA;VENTOLIN HFA) 108 (90 Base) MCG/ACT inhaler Inhale 2 puffs into the lungs every 4 (four) hours as needed for wheezing or shortness of breath. 11/27/16   Horton, Mayer Masker, MD  HYDROcodone-acetaminophen (NORCO/VICODIN) 5-325 MG tablet Take 1-2 tablets by mouth every 6 (six) hours as needed for severe pain. 02/21/17   Shaune Pollack, MD  ibuprofen (ADVIL,MOTRIN) 800 MG tablet Take 1 tablet (800 mg total) by mouth every 8 (eight) hours as needed. 08/12/17   Terrilee Files, MD  loratadine (CLARITIN) 10 MG tablet Take 1 tablet (10 mg total) by mouth daily. 11/27/16   Horton, Mayer Masker, MD  omeprazole (PRILOSEC) 40 MG capsule Take 40 mg by mouth daily.    [provider]  predniSONE (DELTASONE) 20  MG tablet Take 2 tablets (40 mg total) by mouth daily. 11/27/16   Horton, Mayer Masker, MD  Prenatal Vit-Fe Fumarate-FA (PRENATAL VITAMIN PO) Take by mouth.    [provider]    Family History Family History  Problem Relation Age of Onset  . Heart disease Father   . Arthritis Mother        spondylitis  . Diabetes Maternal Uncle   . Arthritis Maternal Grandmother   . Arthritis Maternal Grandfather   . Arthritis Paternal Grandmother        RA  . Arthritis Paternal Grandfather   . Hyperlipidemia Paternal Grandfather   . Hypertension Paternal Grandfather    . Stroke Paternal Grandfather   . Kidney disease Paternal Grandfather   . Heart disease Paternal Grandfather     Social History Social History   Tobacco Use  . Smoking status: Never Smoker  . Smokeless tobacco: Never Used  Substance Use Topics  . Alcohol use: Yes    Comment: 1-2 drinks per month  . Drug use: No     Allergies   Patient has no known allergies.   Review of Systems Review of Systems  Constitutional: Positive for fever.  HENT: Negative for sore throat.   Respiratory: Positive for cough.   Cardiovascular: Negative for chest pain.  Gastrointestinal: Negative for diarrhea and vomiting.  All other systems reviewed and are negative.    Physical Exam Updated Vital Signs BP 117/79   Pulse (!) 128   Temp (!) 100.8 F (38.2 C)   Resp 18   Ht 5\' 3"  (1.6 m)   Wt 81.6 kg   LMP 03/15/2018   SpO2 94%   BMI 31.89 kg/m   Physical Exam Vitals signs and nursing note reviewed.  Constitutional:      General: She is not in acute distress.    Appearance: She is well-developed. She is not diaphoretic.  HENT:     Head: Normocephalic and atraumatic.     Right Ear: Tympanic membrane normal.     Left Ear: Tympanic membrane normal.     Mouth/Throat:     Mouth: Mucous membranes are moist.     Pharynx: Oropharynx is clear. No oropharyngeal exudate or posterior oropharyngeal erythema.  Neck:     Musculoskeletal: Normal range of motion and neck supple.  Cardiovascular:     Rate and Rhythm: Normal rate and regular rhythm.     Heart sounds: No murmur. No friction rub. No gallop.   Pulmonary:     Effort: Pulmonary effort is normal. No respiratory distress.     Breath sounds: Normal breath sounds. No wheezing.  Abdominal:     General: Bowel sounds are normal. There is no distension.     Palpations: Abdomen is soft.     Tenderness: There is no abdominal tenderness.  Musculoskeletal: Normal range of motion.  Skin:    General: Skin is warm and dry.  Neurological:      Mental Status: She is alert and oriented to person, place, and time.      ED Treatments / Results  Labs (all labs ordered are listed, but only abnormal results are displayed) Labs Reviewed - No data to display  EKG None  Radiology No results found.  Procedures Procedures (including critical care time)  Medications Ordered in ED Medications  ibuprofen (ADVIL,MOTRIN) tablet 800 mg (has no administration in time range)     Initial Impression / Assessment and Plan / ED Course  I have reviewed the triage  vital signs and the nursing notes.  Pertinent labs & imaging results that were available during my care of the patient were reviewed by me and considered in my medical decision making (see chart for details).  Patient presents here with fever for the past several hours.  She does report some congestion and cough over the past 2 days.  Temperature upon arrival was 100.8, but is now 98.7 after receiving ibuprofen.  Her physical examination is unremarkable with clear lungs and benign abdomen.  Oxygen saturations are currently 97% upon reexamination.  I highly suspect a viral etiology to her symptoms.  I feel as though she is appropriate for discharge.  Final Clinical Impressions(s) / ED Diagnoses   Final diagnoses:  None    ED Discharge Orders    None       Geoffery Lyons, MD 03/15/18 [phone]

## 2018-03-15 NOTE — ED Triage Notes (Addendum)
C/o fever that started a couple hours ago. C/o chills. C/o general aching. Not able to take the flu shot. Pt has had a cough. Has not taken any medications prior to arrival.

## 2018-03-15 NOTE — ED Notes (Signed)
ED Provider at bedside. 

## 2018-03-15 NOTE — Discharge Instructions (Addendum)
Ibuprofen 600 mg rotated with Tylenol 1000 mg every 4 hours as needed for pain or fever.  Return to the emergency department if you develop difficulty breathing, severe abdominal pain, bloody stools, or other new and concerning symptoms.

## 2018-10-23 ENCOUNTER — Other Ambulatory Visit: Payer: Self-pay

## 2018-10-23 ENCOUNTER — Emergency Department (HOSPITAL_BASED_OUTPATIENT_CLINIC_OR_DEPARTMENT_OTHER)
Admission: EM | Admit: 2018-10-23 | Discharge: 2018-10-23 | Disposition: A | Discharge status: Home / Self Care | Payer: BC Managed Care – PPO | Attending: Emergency Medicine | Admitting: Emergency Medicine

## 2018-10-23 ENCOUNTER — Encounter (HOSPITAL_BASED_OUTPATIENT_CLINIC_OR_DEPARTMENT_OTHER): Payer: Self-pay | Admitting: Emergency Medicine

## 2018-10-23 DIAGNOSIS — R531 Weakness: Secondary | ICD-10-CM | POA: Diagnosis not present

## 2018-10-23 DIAGNOSIS — Z79899 Other long term (current) drug therapy: Secondary | ICD-10-CM | POA: Diagnosis not present

## 2018-10-23 DIAGNOSIS — Z7984 Long term (current) use of oral hypoglycemic drugs: Secondary | ICD-10-CM | POA: Insufficient documentation

## 2018-10-23 DIAGNOSIS — E119 Type 2 diabetes mellitus without complications: Secondary | ICD-10-CM | POA: Diagnosis not present

## 2018-10-23 DIAGNOSIS — B3731 Acute candidiasis of vulva and vagina: Secondary | ICD-10-CM

## 2018-10-23 DIAGNOSIS — M069 Rheumatoid arthritis, unspecified: Secondary | ICD-10-CM | POA: Insufficient documentation

## 2018-10-23 DIAGNOSIS — B373 Candidiasis of vulva and vagina: Secondary | ICD-10-CM | POA: Diagnosis not present

## 2018-10-23 HISTORY — DX: Polycystic ovarian syndrome: E28.2

## 2018-10-23 LAB — CBC WITH DIFFERENTIAL/PLATELET
Abs Immature Granulocytes: 0.03 10*3/uL (ref 0.00–0.07)
Basophils Absolute: 0 10*3/uL (ref 0.0–0.1)
Basophils Relative: 0 %
Eosinophils Absolute: 0.1 10*3/uL (ref 0.0–0.5)
Eosinophils Relative: 0 %
HCT: 39 % (ref 36.0–46.0)
Hemoglobin: 12.6 g/dL (ref 12.0–15.0)
Immature Granulocytes: 0 %
Lymphocytes Relative: 18 %
Lymphs Abs: 2.1 10*3/uL (ref 0.7–4.0)
MCH: 30.9 pg (ref 26.0–34.0)
MCHC: 32.3 g/dL (ref 30.0–36.0)
MCV: 95.6 fL (ref 80.0–100.0)
Monocytes Absolute: 0.8 10*3/uL (ref 0.1–1.0)
Monocytes Relative: 7 %
Neutro Abs: 8.8 10*3/uL — ABNORMAL HIGH (ref 1.7–7.7)
Neutrophils Relative %: 75 %
Platelets: 282 10*3/uL (ref 150–400)
RBC: 4.08 MIL/uL (ref 3.87–5.11)
RDW: 13.1 % (ref 11.5–15.5)
WBC: 11.8 10*3/uL — ABNORMAL HIGH (ref 4.0–10.5)
nRBC: 0 % (ref 0.0–0.2)

## 2018-10-23 LAB — COMPREHENSIVE METABOLIC PANEL
ALT: 23 U/L (ref 0–44)
AST: 25 U/L (ref 15–41)
Albumin: 4.6 g/dL (ref 3.5–5.0)
Alkaline Phosphatase: 64 U/L (ref 38–126)
Anion gap: 13 (ref 5–15)
BUN: 10 mg/dL (ref 6–20)
CO2: 21 mmol/L — ABNORMAL LOW (ref 22–32)
Calcium: 8.9 mg/dL (ref 8.9–10.3)
Chloride: 104 mmol/L (ref 98–111)
Creatinine, Ser: 0.8 mg/dL (ref 0.44–1.00)
GFR calc Af Amer: >60 mL/min (ref >60)
GFR calc non Af Amer: >60 mL/min (ref >60)
Glucose, Bld: 105 mg/dL — ABNORMAL HIGH (ref 70–99)
Potassium: 3.9 mmol/L (ref 3.5–5.1)
Sodium: 138 mmol/L (ref 135–145)
Total Bilirubin: 0.3 mg/dL (ref 0.3–1.2)
Total Protein: 8 g/dL (ref 6.5–8.1)

## 2018-10-23 LAB — URINALYSIS, MICROSCOPIC (REFLEX)

## 2018-10-23 LAB — CBG MONITORING, ED: Glucose-Capillary: 103 mg/dL — ABNORMAL HIGH (ref 70–99)

## 2018-10-23 LAB — URINALYSIS, ROUTINE W REFLEX MICROSCOPIC
Bilirubin Urine: NEGATIVE
Glucose, UA: NEGATIVE mg/dL
Hgb urine dipstick: NEGATIVE
Ketones, ur: NEGATIVE mg/dL
Nitrite: NEGATIVE
Protein, ur: NEGATIVE mg/dL
Specific Gravity, Urine: 1.015 (ref 1.005–1.030)
pH: 6 (ref 5.0–8.0)

## 2018-10-23 LAB — PREGNANCY, URINE: Preg Test, Ur: NEGATIVE

## 2018-10-23 MED ORDER — FLUCONAZOLE 150 MG PO TABS
150.0000 mg | ORAL_TABLET | Freq: Once | ORAL | Status: AC
  Administered 2018-10-23: 03:00:00 150 mg via ORAL
  Filled 2018-10-23: qty 1

## 2018-10-23 MED ORDER — ONDANSETRON HCL 4 MG/2ML IJ SOLN
4.0000 mg | Freq: Once | INTRAMUSCULAR | Status: AC
  Administered 2018-10-23: 4 mg via INTRAVENOUS
  Filled 2018-10-23: qty 2

## 2018-10-23 NOTE — ED Provider Notes (Signed)
[facility] Provider Note: Brandi Dell, MD, FACEP  CSN: [id number] MRN: [id number] ARRIVAL: 10/23/18 at 0045 ROOM: [facility]   CHIEF COMPLAINT  Weakness   HISTORY OF PRESENT ILLNESS  10/23/18 1:03 AM Brandi Parrish is a 38 y.o. female with a history of rheumatoid arthritis, fibromyalgia and borderline diabetes.  She is here with generalized weakness that began about 11:30 PM yesterday evening.  She felt tremulous and ate a peanut butter sandwich which relieved the tremors after about 10 minutes but continues to feel weak.  She is also having nausea and is burping but not vomiting or having diarrhea.  She has not vomited.  She describes a "woozy" feeling in her head similar to to when she is taken narcotics in the past.  She denies any narcotics or other new medications.  She is on phentermine for weight loss.  She is not aware of having a fever.  She denies dysuria.  She denies frank pain.   Past Medical History:  Diagnosis Date  . Arthritis    autoimmune rheumatoid arthritis  . Depression   . Fibromyalgia   . GERD (gastroesophageal reflux disease)   . History of chicken pox   . PCOS (polycystic ovarian syndrome)     Past Surgical History:  Procedure Laterality Date  . CESAREAN SECTION  2009  . CESAREAN SECTION  2012  . TUBAL LIGATION  2012    Family History  Problem Relation Age of Onset  . Heart disease Father   . Arthritis Mother        spondylitis  . Diabetes Maternal Uncle   . Arthritis Maternal Grandmother   . Arthritis Maternal Grandfather   . Arthritis Paternal Grandmother        RA  . Arthritis Paternal Grandfather   . Hyperlipidemia Paternal Grandfather   . Hypertension Paternal Grandfather   . Stroke Paternal Grandfather   . Kidney disease Paternal Grandfather   . Heart disease Paternal Grandfather     Social History   Tobacco Use  . Smoking status: Never Smoker  . Smokeless tobacco: Never Used  Substance Use Topics  . Alcohol  use: Yes    Comment: 1-2 drinks per month  . Drug use: No    Prior to Admission medications   Medication Sig Start Date End Date Taking? Authorizing Provider  esomeprazole (NEXIUM) 40 MG capsule Take 20 mg by mouth daily at 12 noon.    Yes [provider]  gabapentin (NEURONTIN) 300 MG capsule Take 300 mg by mouth 4 (four) times daily.   Yes [provider]  metFORMIN (GLUCOPHAGE-XR) 500 MG 24 hr tablet Take 500 mg by mouth daily with breakfast.   Yes [provider]  methocarbamol (ROBAXIN) 750 MG tablet Take 750 mg by mouth 4 (four) times daily.   Yes [provider]  Methylsulfonylmethane 500 MG CAPS Take 500 mg by mouth.   Yes [provider]  phentermine 37.5 MG capsule Take 37.5 mg by mouth every morning.   Yes [provider]  traMADol (ULTRAM) 50 MG tablet Take by mouth every 6 (six) hours as needed.   Yes [provider]  albuterol (PROVENTIL HFA;VENTOLIN HFA) 108 (90 Base) MCG/ACT inhaler Inhale 2 puffs into the lungs every 4 (four) hours as needed for wheezing or shortness of breath. 11/27/16   Horton, Mayer Masker, MD  HYDROcodone-acetaminophen (NORCO/VICODIN) 5-325 MG tablet Take 1-2 tablets by mouth every 6 (six) hours as needed for severe pain. 02/21/17  Shaune Pollack, MD  ibuprofen (ADVIL,MOTRIN) 800 MG tablet Take 1 tablet (800 mg total) by mouth every 8 (eight) hours as needed. 08/12/17   Terrilee Files, MD  loratadine (CLARITIN) 10 MG tablet Take 1 tablet (10 mg total) by mouth daily. 11/27/16   Horton, Mayer Masker, MD  omeprazole (PRILOSEC) 40 MG capsule Take 40 mg by mouth daily.    [provider]  predniSONE (DELTASONE) 20 MG tablet Take 2 tablets (40 mg total) by mouth daily. 11/27/16   Horton, Mayer Masker, MD  Prenatal Vit-Fe Fumarate-FA (PRENATAL VITAMIN PO) Take by mouth.    [provider]  Semaglutide (OZEMPIC, 0.25 OR 0.5 MG/DOSE, [place]) Inject into the skin.    [provider]   spironolactone (ALDACTONE) 100 MG tablet Take 50 mg by mouth daily.     [provider]    Allergies Patient has no known allergies.   REVIEW OF SYSTEMS  Negative except as noted here or in the History of Present Illness.   PHYSICAL EXAMINATION  Initial Vital Signs Blood pressure 111/87, pulse 96, temperature 98.1 F (36.7 C), temperature source Oral, resp. rate 20, height 5\' 3"  (1.6 m), weight 84.4 kg, last menstrual period 10/09/2018, SpO2 99 %.  Examination General: Well-developed, well-nourished female in no acute distress; appearance consistent with age of record HENT: normocephalic; atraumatic Eyes: pupils equal, round and reactive to light; extraocular muscles intact Neck: supple Heart: regular rate and rhythm Lungs: clear to auscultation bilaterally Abdomen: soft; nondistended; nontender; no masses or hepatosplenomegaly; bowel sounds present Extremities: No deformity; full range of motion; pulses normal Neurologic: Awake, alert and oriented; motor function intact in all extremities and symmetric; no facial droop Skin: Warm and dry; facial hirsutism Psychiatric: Flat affect; anxious   RESULTS  Summary of this visit's results, reviewed by myself:   EKG Interpretation  Date/Time:  Friday October 23 2018 01:37:15 EDT Ventricular Rate:  90 PR Interval:    QRS Duration: 84 QT Interval:  388 QTC Calculation: 475 R Axis:   15 Text Interpretation:  Sinus rhythm Normal ECG No significant change was found Reconfirmed by [id number] (Paula Libra) on 10/23/2018 1:40:58 AM      Laboratory Studies: Results for orders placed or performed during the hospital encounter of 10/23/18 (from the past 24 hour(s))  CBG monitoring, ED     Status: Abnormal   Collection Time: 10/23/18  1:15 AM  Result Value Ref Range   Glucose-Capillary 103 (H) 70 - 99 mg/dL  CBC with Differential/Platelet     Status: Abnormal   Collection Time: 10/23/18  1:58 AM  Result Value Ref Range   WBC 11.8  (H) 4.0 - 10.5 K/uL   RBC 4.08 3.87 - 5.11 MIL/uL   Hemoglobin 12.6 12.0 - 15.0 g/dL   HCT 10/25/18 29.9 - 37.1 %   MCV 95.6 80.0 - 100.0 fL   MCH 30.9 26.0 - 34.0 pg   MCHC 32.3 30.0 - 36.0 g/dL   RDW 69.6 78.9 - 38.1 %   Platelets 282 150 - 400 K/uL   nRBC 0.0 0.0 - 0.2 %   Neutrophils Relative % 75 %   Neutro Abs 8.8 (H) 1.7 - 7.7 K/uL   Lymphocytes Relative 18 %   Lymphs Abs 2.1 0.7 - 4.0 K/uL   Monocytes Relative 7 %   Monocytes Absolute 0.8 0.1 - 1.0 K/uL   Eosinophils Relative 0 %   Eosinophils Absolute 0.1 0.0 - 0.5 K/uL   Basophils Relative 0 %  Basophils Absolute 0.0 0.0 - 0.1 K/uL   Immature Granulocytes 0 %   Abs Immature Granulocytes 0.03 0.00 - 0.07 K/uL  Comprehensive metabolic panel     Status: Abnormal   Collection Time: 10/23/18  1:58 AM  Result Value Ref Range   Sodium 138 135 - 145 mmol/L   Potassium 3.9 3.5 - 5.1 mmol/L   Chloride 104 98 - 111 mmol/L   CO2 21 (L) 22 - 32 mmol/L   Glucose, Bld 105 (H) 70 - 99 mg/dL   BUN 10 6 - 20 mg/dL   Creatinine, Ser 0.80 0.44 - 1.00 mg/dL   Calcium 8.9 8.9 - 10.3 mg/dL   Total Protein 8.0 6.5 - 8.1 g/dL   Albumin 4.6 3.5 - 5.0 g/dL   AST 25 15 - 41 U/L   ALT 23 0 - 44 U/L   Alkaline Phosphatase 64 38 - 126 U/L   Total Bilirubin 0.3 0.3 - 1.2 mg/dL   GFR calc non Af Amer >60 >60 mL/min   GFR calc Af Amer >60 >60 mL/min   Anion gap 13 5 - 15  Urinalysis, Routine w reflex microscopic     Status: Abnormal   Collection Time: 10/23/18  2:14 AM  Result Value Ref Range   Color, Urine YELLOW YELLOW   APPearance CLOUDY (A) CLEAR   Specific Gravity, Urine 1.015 1.005 - 1.030   pH 6.0 5.0 - 8.0   Glucose, UA NEGATIVE NEGATIVE mg/dL   Hgb urine dipstick NEGATIVE NEGATIVE   Bilirubin Urine NEGATIVE NEGATIVE   Ketones, ur NEGATIVE NEGATIVE mg/dL   Protein, ur NEGATIVE NEGATIVE mg/dL   Nitrite NEGATIVE NEGATIVE   Leukocytes,Ua TRACE (A) NEGATIVE  Pregnancy, urine     Status: None   Collection Time: 10/23/18  2:14 AM   Result Value Ref Range   Preg Test, Ur NEGATIVE NEGATIVE  Urinalysis, Microscopic (reflex)     Status: Abnormal   Collection Time: 10/23/18  2:14 AM  Result Value Ref Range   RBC / HPF 0-5 0 - 5 RBC/hpf   WBC, UA 0-5 0 - 5 WBC/hpf   Bacteria, UA MANY (A) NONE SEEN   Squamous Epithelial / LPF 11-20 0 - 5   Budding Yeast PRESENT    Imaging Studies: No results found.  ED COURSE and MDM  Nursing notes and initial vitals signs, including pulse oximetry, reviewed.  Vitals:   10/23/18 0053 10/23/18 0054  BP: 111/87   Pulse: 96   Resp: 20   Temp: 98.1 F (36.7 C)   TempSrc: Oral   SpO2: 99%   Weight:  84.4 kg  Height:  5\' 3"  (1.6 m)   3:00 AM Patient feeling better.  The only abnormalities found on laboratory studies were a mild leukocytosis and yeast in her urine.  She was given Diflucan 150 mg orally.  She states she is comfortable going home and was advised to return if symptoms change or worsen.  She plans to follow-up with her primary care physician.  PROCEDURES    ED DIAGNOSES     ICD-10-CM   1. Generalized weakness  R53.1   2. Candidiasis of genitalia in female  B37.3        Cuma Polyakov, MD 10/23/18 [phone]

## 2018-10-23 NOTE — ED Triage Notes (Signed)
Pt report feeling sudden onset bilateral weakness, fatigue, shakiness starting at 2330. Ate a peanut butter sandwich and started feeling nausea. Reports burping.

## 2019-01-28 ENCOUNTER — Other Ambulatory Visit: Payer: Self-pay

## 2019-01-28 ENCOUNTER — Emergency Department (HOSPITAL_COMMUNITY)
Admission: EM | Admit: 2019-01-28 | Discharge: 2019-01-28 | Disposition: A | Discharge status: Home / Self Care | Payer: BC Managed Care – PPO | Attending: Emergency Medicine | Admitting: Emergency Medicine

## 2019-01-28 ENCOUNTER — Emergency Department (HOSPITAL_COMMUNITY): Payer: BC Managed Care – PPO

## 2019-01-28 DIAGNOSIS — Z79899 Other long term (current) drug therapy: Secondary | ICD-10-CM | POA: Diagnosis not present

## 2019-01-28 DIAGNOSIS — M79621 Pain in right upper arm: Secondary | ICD-10-CM | POA: Insufficient documentation

## 2019-01-28 DIAGNOSIS — M069 Rheumatoid arthritis, unspecified: Secondary | ICD-10-CM | POA: Insufficient documentation

## 2019-01-28 DIAGNOSIS — M797 Fibromyalgia: Secondary | ICD-10-CM | POA: Insufficient documentation

## 2019-01-28 DIAGNOSIS — M79601 Pain in right arm: Secondary | ICD-10-CM

## 2019-01-28 NOTE — ED Provider Notes (Signed)
[facility] EMERGENCY DEPARTMENT Provider Note   CSN: [id number] Arrival date & time: 01/28/19  1648     History   Chief Complaint Chief Complaint  Patient presents with   Arm Pain    right    HPI Brandi Parrish is a 38 y.o. female history of fibromyalgia, rheumatoid arthritis, GERD, PCOS.  Presents today with 1 week of right biceps pain, moderate intensity aching sensation constant worsened with movement and palpation and without alleviating factors, has been taking gabapentin as per normal without improvement, denies radiation of pain.  Denies any injury of the area.  Reports intermittent pain in the past but associated with her fibromyalgia.  She denies any history of fever/chills, headache/vision changes, neck pain/stiffness, numbness/weakness, tingling, IV drug use, overlying skin changes or any additional concerns    HPI  Past Medical History:  Diagnosis Date   Arthritis    autoimmune rheumatoid arthritis   Depression    Fibromyalgia    GERD (gastroesophageal reflux disease)    History of chicken pox    PCOS (polycystic ovarian syndrome)     Patient Active Problem List   Diagnosis Date Noted   Acute low back pain with radicular symptoms, duration less than 6 weeks 03/08/2013   Psoriasis 03/08/2013   Routine general medical examination at a health care facility 09/01/2012   Rheumatoid arthritis(714.0) 07/18/2012   Hirsutism 07/18/2012   GERD (gastroesophageal reflux disease) 07/18/2012   Depression 07/18/2012   Migraine 07/18/2012    Past Surgical History:  Procedure Laterality Date   CESAREAN SECTION  2009   CESAREAN SECTION  2012   TUBAL LIGATION  2012     OB History   No obstetric history on file.      Home Medications    Prior to Admission medications   Medication Sig Start Date End Date Taking? Authorizing Provider  albuterol (PROVENTIL HFA;VENTOLIN HFA) 108 (90 Base) MCG/ACT inhaler Inhale 2 puffs into  the lungs every 4 (four) hours as needed for wheezing or shortness of breath. 11/27/16   Horton, Mayer Masker, MD  esomeprazole (NEXIUM) 40 MG capsule Take 20 mg by mouth daily at 12 noon.     [provider]  gabapentin (NEURONTIN) 300 MG capsule Take 300 mg by mouth 4 (four) times daily.    [provider]  metFORMIN (GLUCOPHAGE-XR) 500 MG 24 hr tablet Take 500 mg by mouth daily with breakfast.    [provider]  methocarbamol (ROBAXIN) 750 MG tablet Take 750 mg by mouth 4 (four) times daily.    [provider]  Methylsulfonylmethane 500 MG CAPS Take 500 mg by mouth.    [provider]  omeprazole (PRILOSEC) 40 MG capsule Take 40 mg by mouth daily.    [provider]  phentermine 37.5 MG capsule Take 37.5 mg by mouth every morning.    [provider]  Semaglutide (OZEMPIC, 0.25 OR 0.5 MG/DOSE, Gravity) Inject into the skin.    [provider]  traMADol (ULTRAM) 50 MG tablet Take by mouth every 6 (six) hours as needed.    [provider]  loratadine (CLARITIN) 10 MG tablet Take 1 tablet (10 mg total) by mouth daily. 11/27/16 10/23/18  Horton, Mayer Masker, MD  spironolactone (ALDACTONE) 100 MG tablet Take 50 mg by mouth daily.   10/23/18  [provider]    Family History Family History  Problem Relation Age of Onset   Heart disease Father    Arthritis Mother  spondylitis   Diabetes Maternal Uncle    Arthritis Maternal Grandmother    Arthritis Maternal Grandfather    Arthritis Paternal Grandmother        RA   Arthritis Paternal Grandfather    Hyperlipidemia Paternal Grandfather    Hypertension Paternal Grandfather    Stroke Paternal Grandfather    Kidney disease Paternal Grandfather    Heart disease Paternal Grandfather     Social History Social History   Tobacco Use   Smoking status: Never Smoker   Smokeless tobacco: Never Used  Substance Use Topics   Alcohol use: Yes     Comment: 1-2 drinks per month   Drug use: No     Allergies   Patient has no known allergies.   Review of Systems Review of Systems Ten systems are reviewed and are negative for acute change except as noted in the HPI   Physical Exam Updated Vital Signs BP 124/80 (BP Location: Left Arm)    Pulse 97    Temp 98.6 F (37 C) (Oral)    Resp 16    Ht 5\' 3"  (1.6 m)    Wt 86.2 kg    SpO2 98%    BMI 33.66 kg/m   Physical Exam Constitutional:      General: She is not in acute distress.    Appearance: Normal appearance. She is well-developed. She is not ill-appearing or diaphoretic.  HENT:     Head: Normocephalic and atraumatic.     Right Ear: External ear normal.     Left Ear: External ear normal.     Nose: Nose normal.  Eyes:     General: Vision grossly intact. Gaze aligned appropriately.     Pupils: Pupils are equal, round, and reactive to light.  Neck:     Musculoskeletal: Normal range of motion.     Trachea: Trachea and phonation normal. No tracheal deviation.  Pulmonary:     Effort: Pulmonary effort is normal. No respiratory distress.  Abdominal:     General: There is no distension.     Palpations: Abdomen is soft.     Tenderness: There is no abdominal tenderness. There is no guarding or rebound.  Musculoskeletal: Normal range of motion.     Comments: Tenderness to palpation of the right biceps muscle, no induration, no swelling, erythema, fluctuance or other abnormal findings.  Appears normal upper extremity.  Neurovascularly intact with strong and equal radial pulses, capillary refill and sensation intact to all fingers, compartments are soft to palpation.  She has full range of motion of the neck, shoulder, elbow, wrist and hand with appropriate strength.  She does endorse some increased pain with flexion of the elbow.  Skin:    General: Skin is warm and dry.     Capillary Refill: Capillary refill takes less than 2 seconds.     Comments: No overlying skin changes    Neurological:     Mental Status: She is alert.     GCS: GCS eye subscore is 4. GCS verbal subscore is 5. GCS motor subscore is 6.     Comments: Speech is clear and goal oriented, follows commands Major Cranial nerves without deficit, no facial droop Moves extremities without ataxia, coordination intact Strong and equal grip strength  Psychiatric:        Behavior: Behavior normal.    ED Treatments / Results  Labs (all labs ordered are listed, but only abnormal results are displayed) Labs Reviewed - No data to display  EKG None  Radiology Dg Shoulder Right  Result Date: 01/28/2019 CLINICAL DATA:  Progressive right arm pain and weakness for 3 months EXAM: RIGHT SHOULDER - 2+ VIEW COMPARISON:  None. FINDINGS: There is no evidence of fracture or dislocation. There is no evidence of arthropathy or other focal bone abnormality. Soft tissues are unremarkable. Included portion of the right chest is unremarkable. IMPRESSION: Negative. Electronically Signed   By: Lovena Le M.D.   On: 01/28/2019 19:02   Dg Humerus Right  Result Date: 01/28/2019 CLINICAL DATA:  Pain, progressively worsening for 3 months EXAM: RIGHT HUMERUS - 2+ VIEW COMPARISON:  None. FINDINGS: Humerus is intact. Mineralization adjacent the greater tuberosity may reflect early hydroxyapatite deposition. Soft tissues are otherwise unremarkable. No suspicious osseous lesion. Alignment at the elbow is grossly normal though incompletely assessed on this non dedicated radiograph. Alignment of the shoulder is better assessed on dedicated radiographs performed the same day. IMPRESSION: Punctate mineralization adjacent the greater tuberosity may reflect early hydroxyapatite deposition/mild calcific tendinosis. No other suspicious osseous lesions. Electronically Signed   By: Lovena Le M.D.   On: 01/28/2019 19:04    Procedures Procedures (including critical care time)  Medications Ordered in ED Medications - No data to  display   Initial Impression / Assessment and Plan / ED Course  I have reviewed the triage vital signs and the nursing notes.  Pertinent labs & imaging results that were available during my care of the patient were reviewed by me and considered in my medical decision making (see chart for details).    38 year old female presents today with right biceps pain, she does not remember any specific injury to the area.  She has pain only along the biceps, worse with movement and palpation.  Examination without evidence of infection, she has no history of IV drug use to suggest abscess.  Tendon is intact on examination.  X-rays today suggestive for possible tendinosis, she has been placed in sling and referred to orthopedist.  There is no evidence of cellulitis, septic arthritis, DVT, compartment syndrome, abscess or other emergent pathologies at this time.  At this time there does not appear to be any evidence of an acute emergency medical condition and the patient appears stable for discharge with appropriate outpatient follow up. Diagnosis was discussed with patient who verbalizes understanding of care plan and is agreeable to discharge. I have discussed return precautions with patient who verbalizes understanding of return precautions. Patient encouraged to follow-up with their PCP and ortho. All questions answered.  Patient's case discussed with Dr. Ralene Bathe who agrees with plan to discharge with follow-up.   Note: Portions of this report may have been transcribed using voice recognition software. Every effort was made to ensure accuracy; however, inadvertent computerized transcription errors may still be present. Final Clinical Impressions(s) / ED Diagnoses   Final diagnoses:  Right arm pain    ED Discharge Orders    None       Gari Crown 01/28/19 2217    Quintella Reichert, MD 01/28/19 2245

## 2019-01-28 NOTE — ED Triage Notes (Signed)
Pt to ED c/o right arm pain and weakeness over the past 3 months, progressively getting worse. Hx or arthritis and fibromyalgia .

## 2019-01-28 NOTE — Discharge Instructions (Addendum)
You have been diagnosed today with right arm pain.  At this time there does not appear to be the presence of an emergent medical condition, however there is always the potential for conditions to change. Please read and follow the below instructions.  Please return to the Emergency Department immediately for any new or worsening symptoms. Please be sure to follow up with your Primary Care Provider within one week regarding your visit today; please call their office to schedule an appointment even if you are feeling better for a follow-up visit. Please call the orthopedics Dr. Griffin Basil on your discharge paperwork for follow-up regarding your biceps pain today. Your x-ray today showed punctate mineralizations adjacent to the greater tuberosity, this may reflect early hydroxyapatite deposition/mild calcific tendinosis.  Please discuss these findings with your primary care provider and orthopedist at your next visit. You may use the sling provided to you today for comfort as well as over-the-counter anti-inflammatory medications such as Tylenol and ibuprofen as directed on the packaging to help with your symptoms.  Get help right away if: You develop severe pain. You have fever or chills You have chest pain or trouble breathing You have redness or swelling You have numbness, tingling or weakness You have any new/concerning or worsening of symptoms  Please read the additional information packets attached to your discharge summary.  Note: Portions of this text may have been transcribed using voice recognition software. Every effort was made to ensure accuracy; however, inadvertent computerized transcription errors may still be present.

## 2019-04-27 DIAGNOSIS — E119 Type 2 diabetes mellitus without complications: Secondary | ICD-10-CM | POA: Insufficient documentation

## 2020-05-27 IMAGING — DX DG HUMERUS 2V *R*
2 series · 2 of 2 positions shown · non-contrast
Comparison: None.

CLINICAL DATA: Pain, progressively worsening for 3 months

EXAM:
RIGHT HUMERUS - 2+ VIEW

[humerus ap]
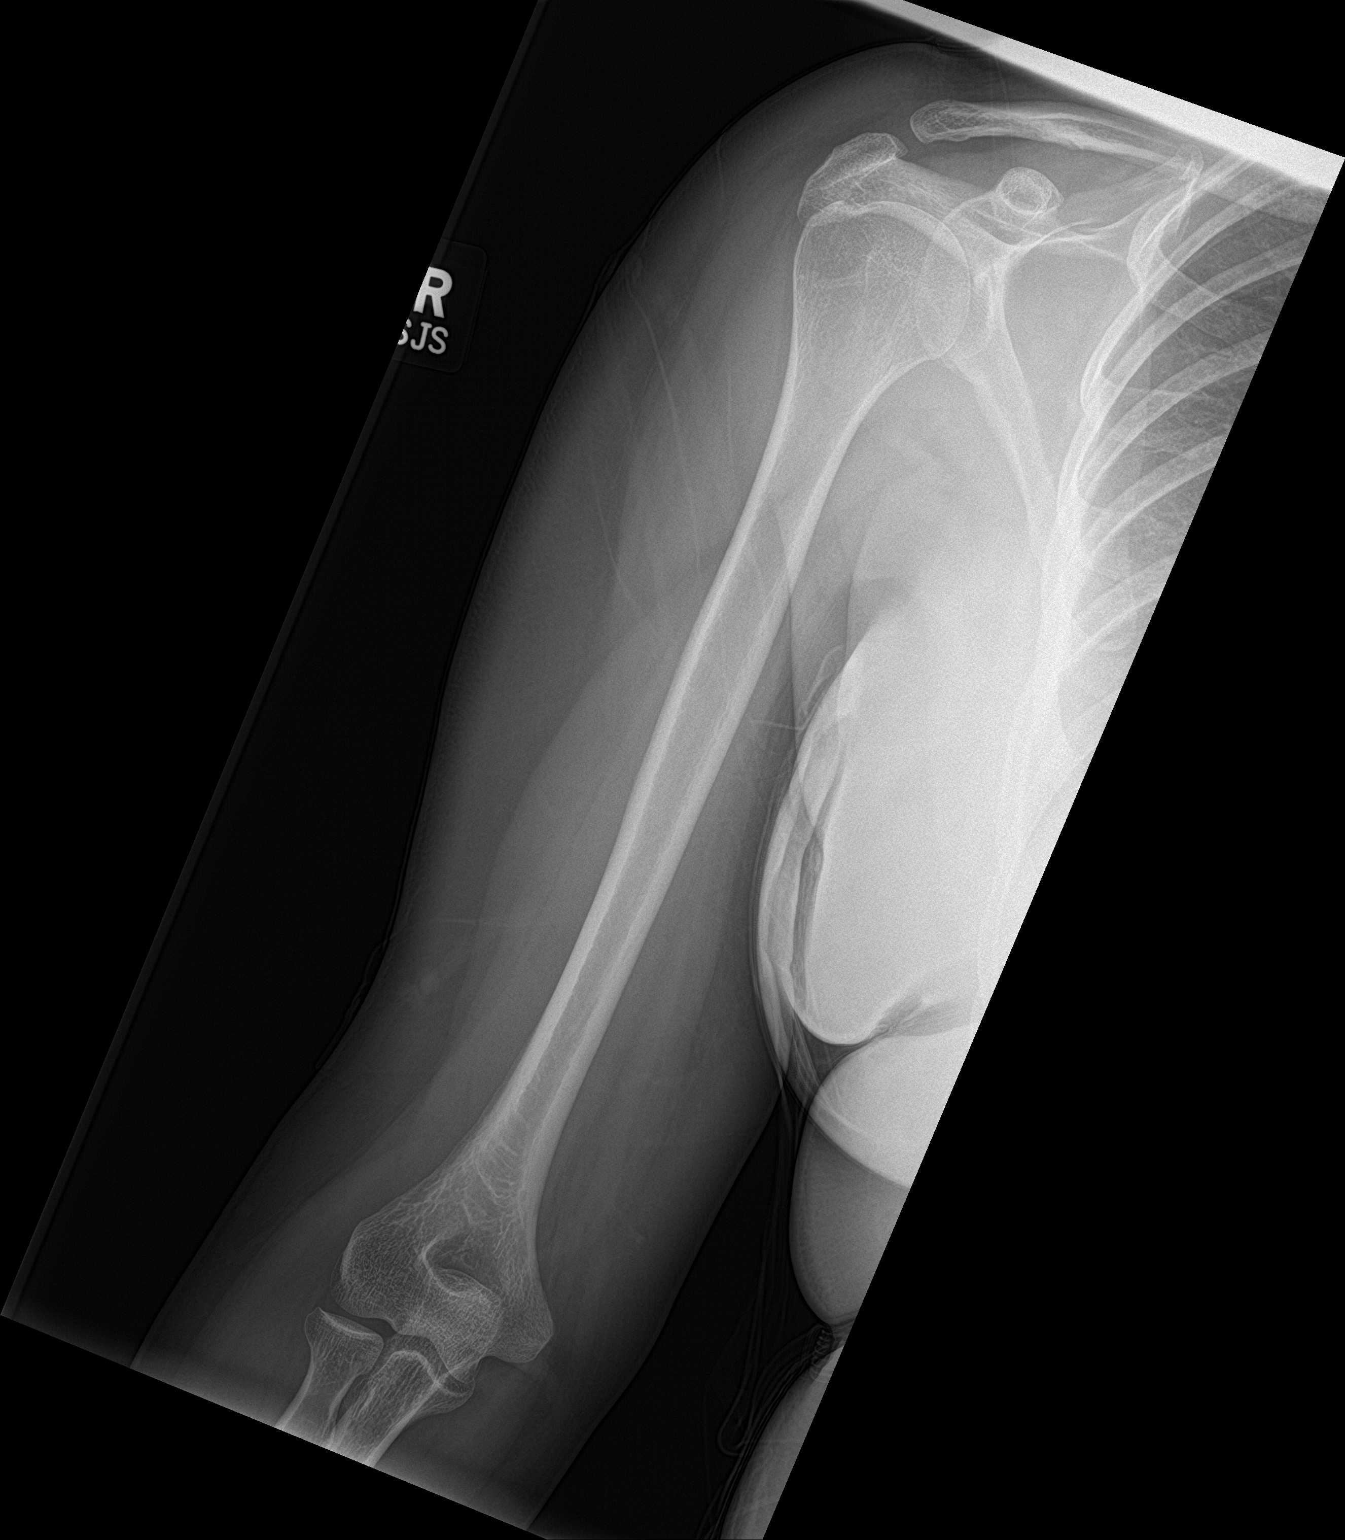

[humerus lat]
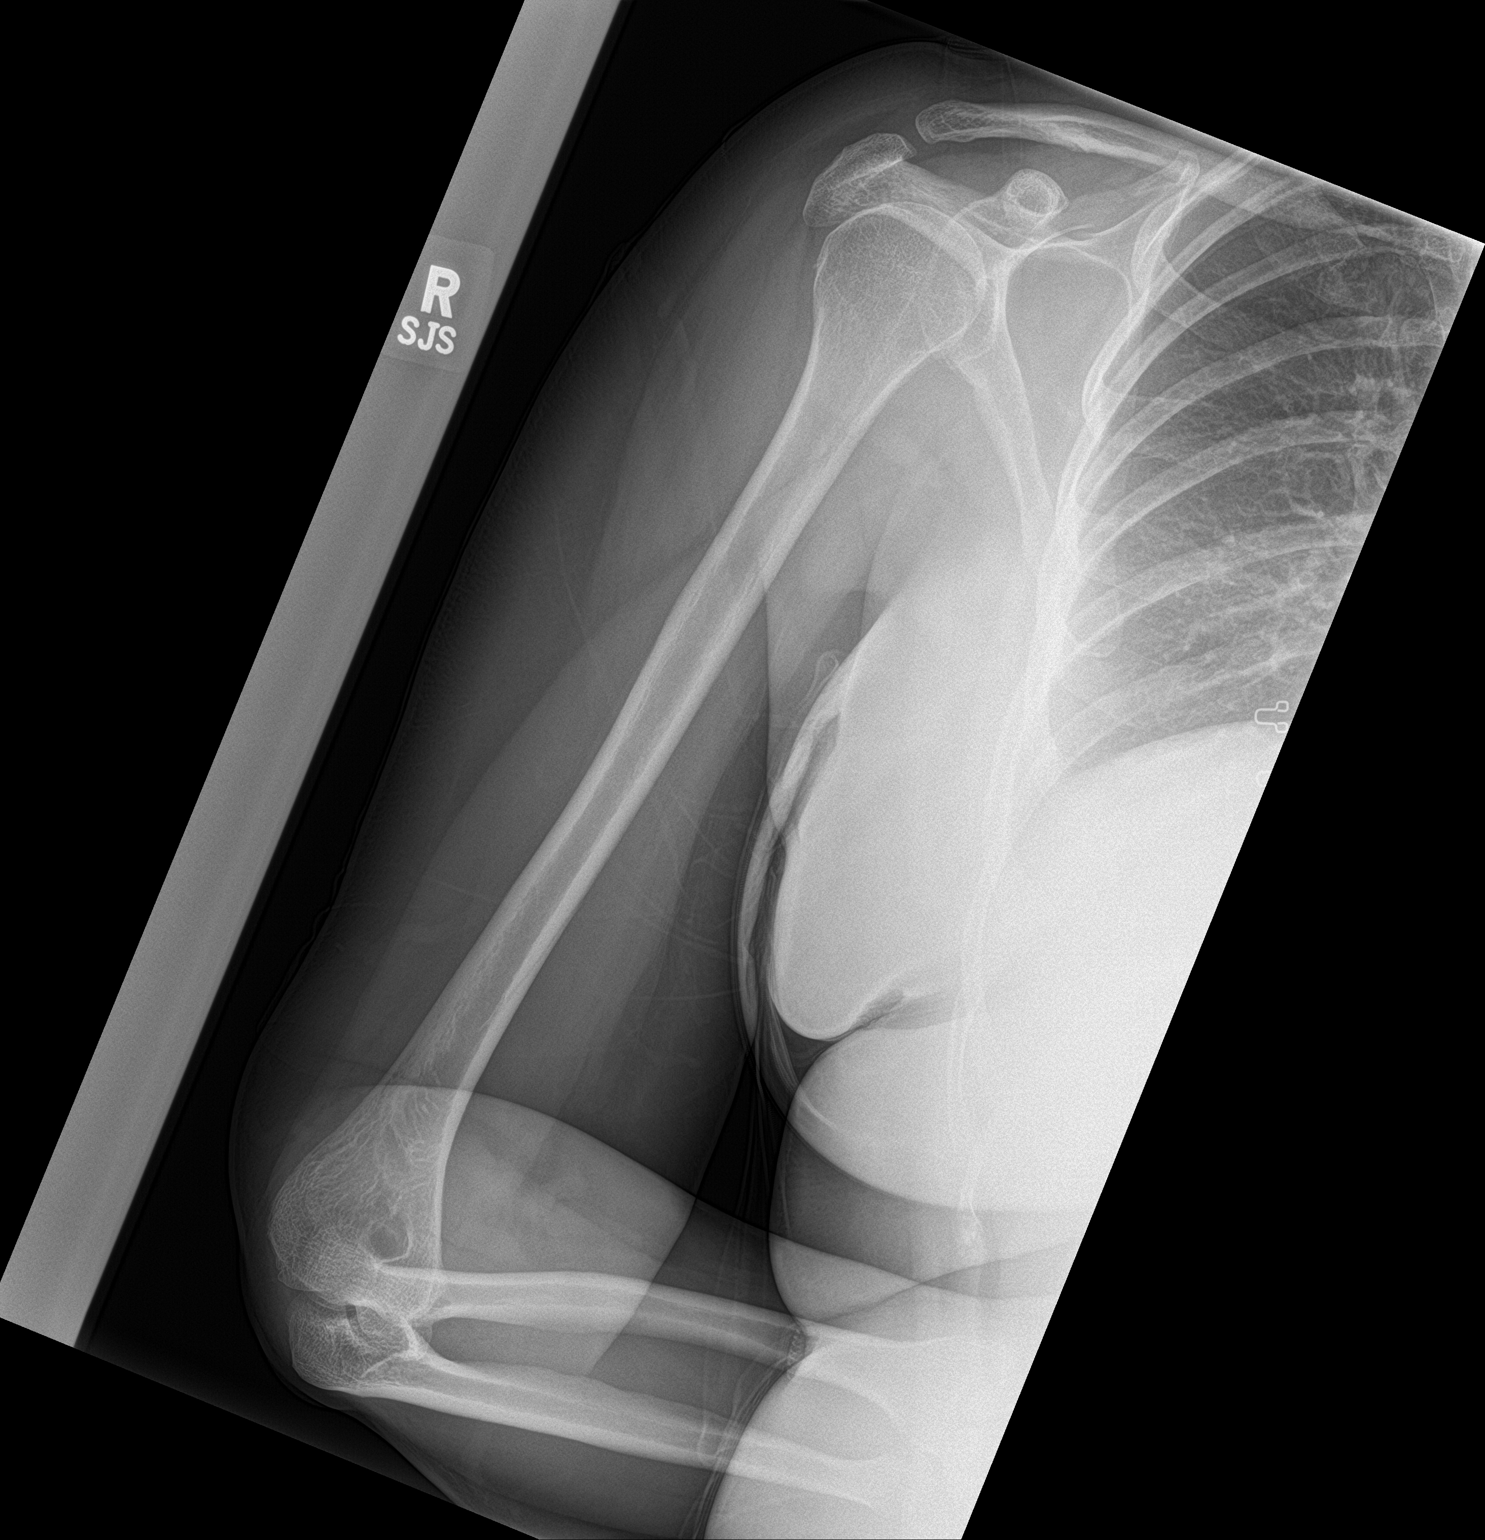

[2 of 2 positions shown; findings below may reference images not displayed]

FINDINGS: Humerus is intact. Mineralization adjacent the greater tuberosity
may reflect early hydroxyapatite deposition. Soft tissues are
otherwise unremarkable. No suspicious osseous lesion. Alignment at
the elbow is grossly normal though incompletely assessed on this non
dedicated radiograph. Alignment of the shoulder is better assessed
on dedicated radiographs performed the same day.
IMPRESSION: Punctate mineralization adjacent the greater tuberosity may reflect
early hydroxyapatite deposition/mild calcific tendinosis.

No other suspicious osseous lesions.

## 2020-05-27 IMAGING — DX DG SHOULDER 2+V*R*
2 series · 2 of 2 positions shown · non-contrast
Comparison: None.

CLINICAL DATA: Progressive right arm pain and weakness for 3 months

EXAM:
RIGHT SHOULDER - 2+ VIEW

[shoulder grashey]
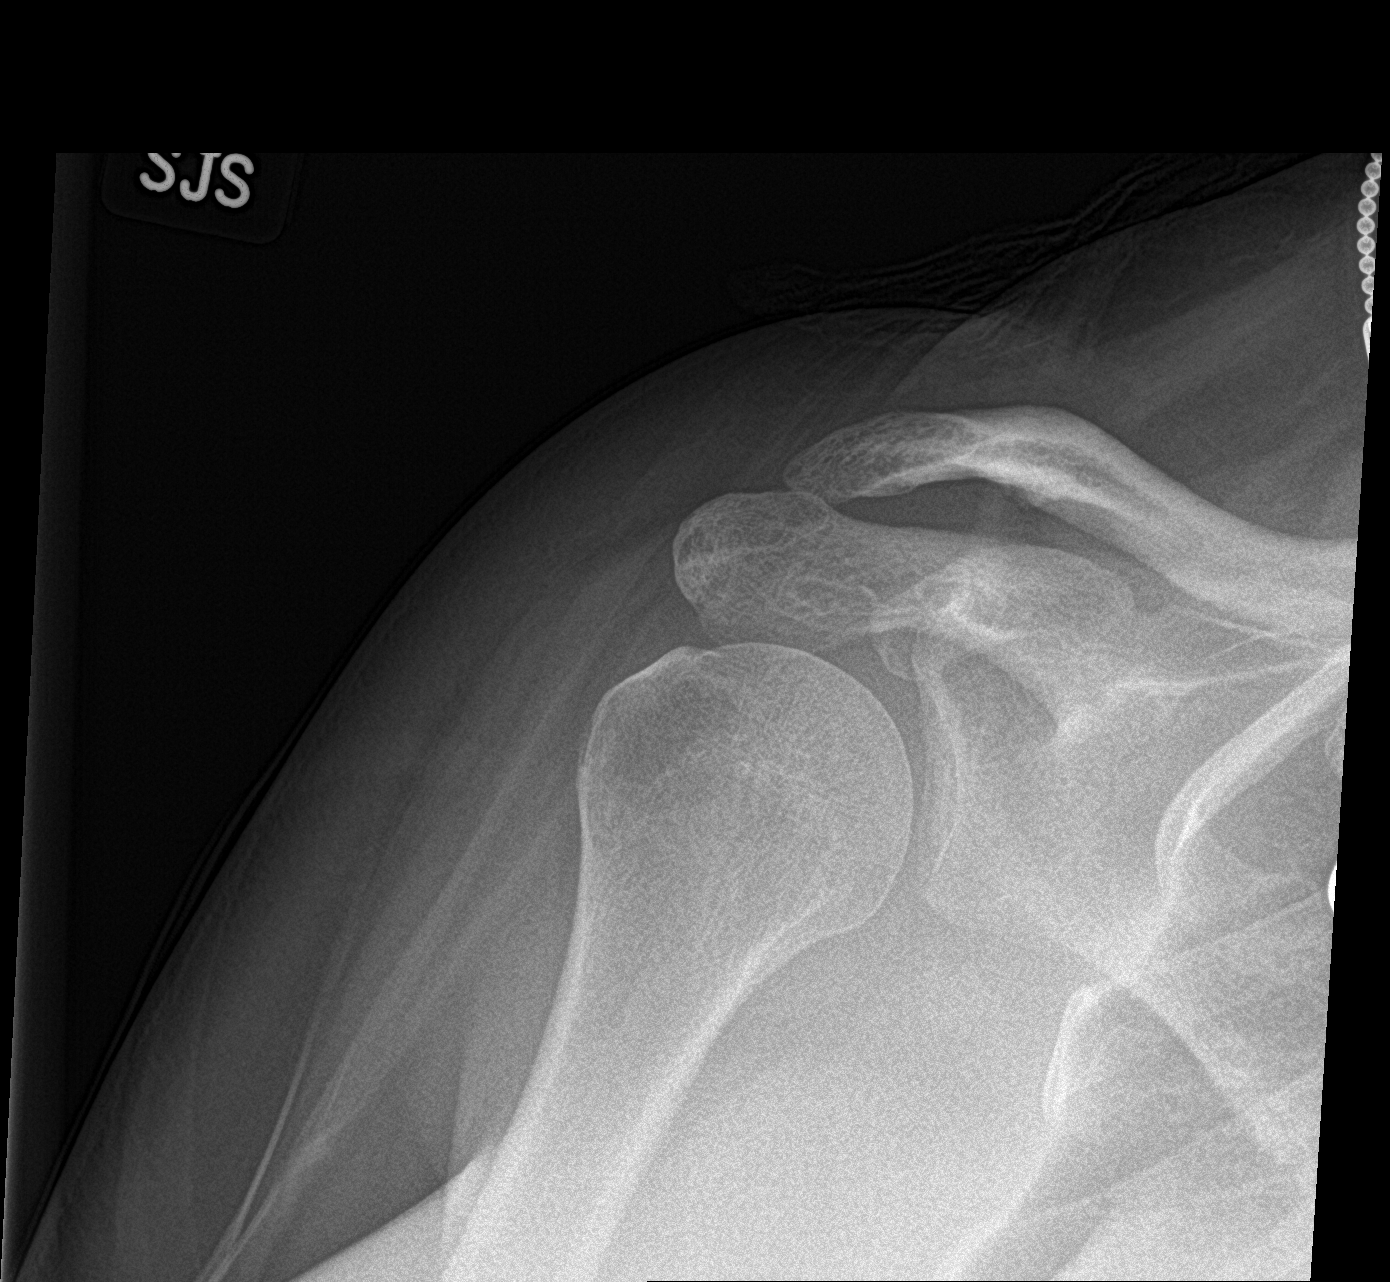

[shoulder y view]
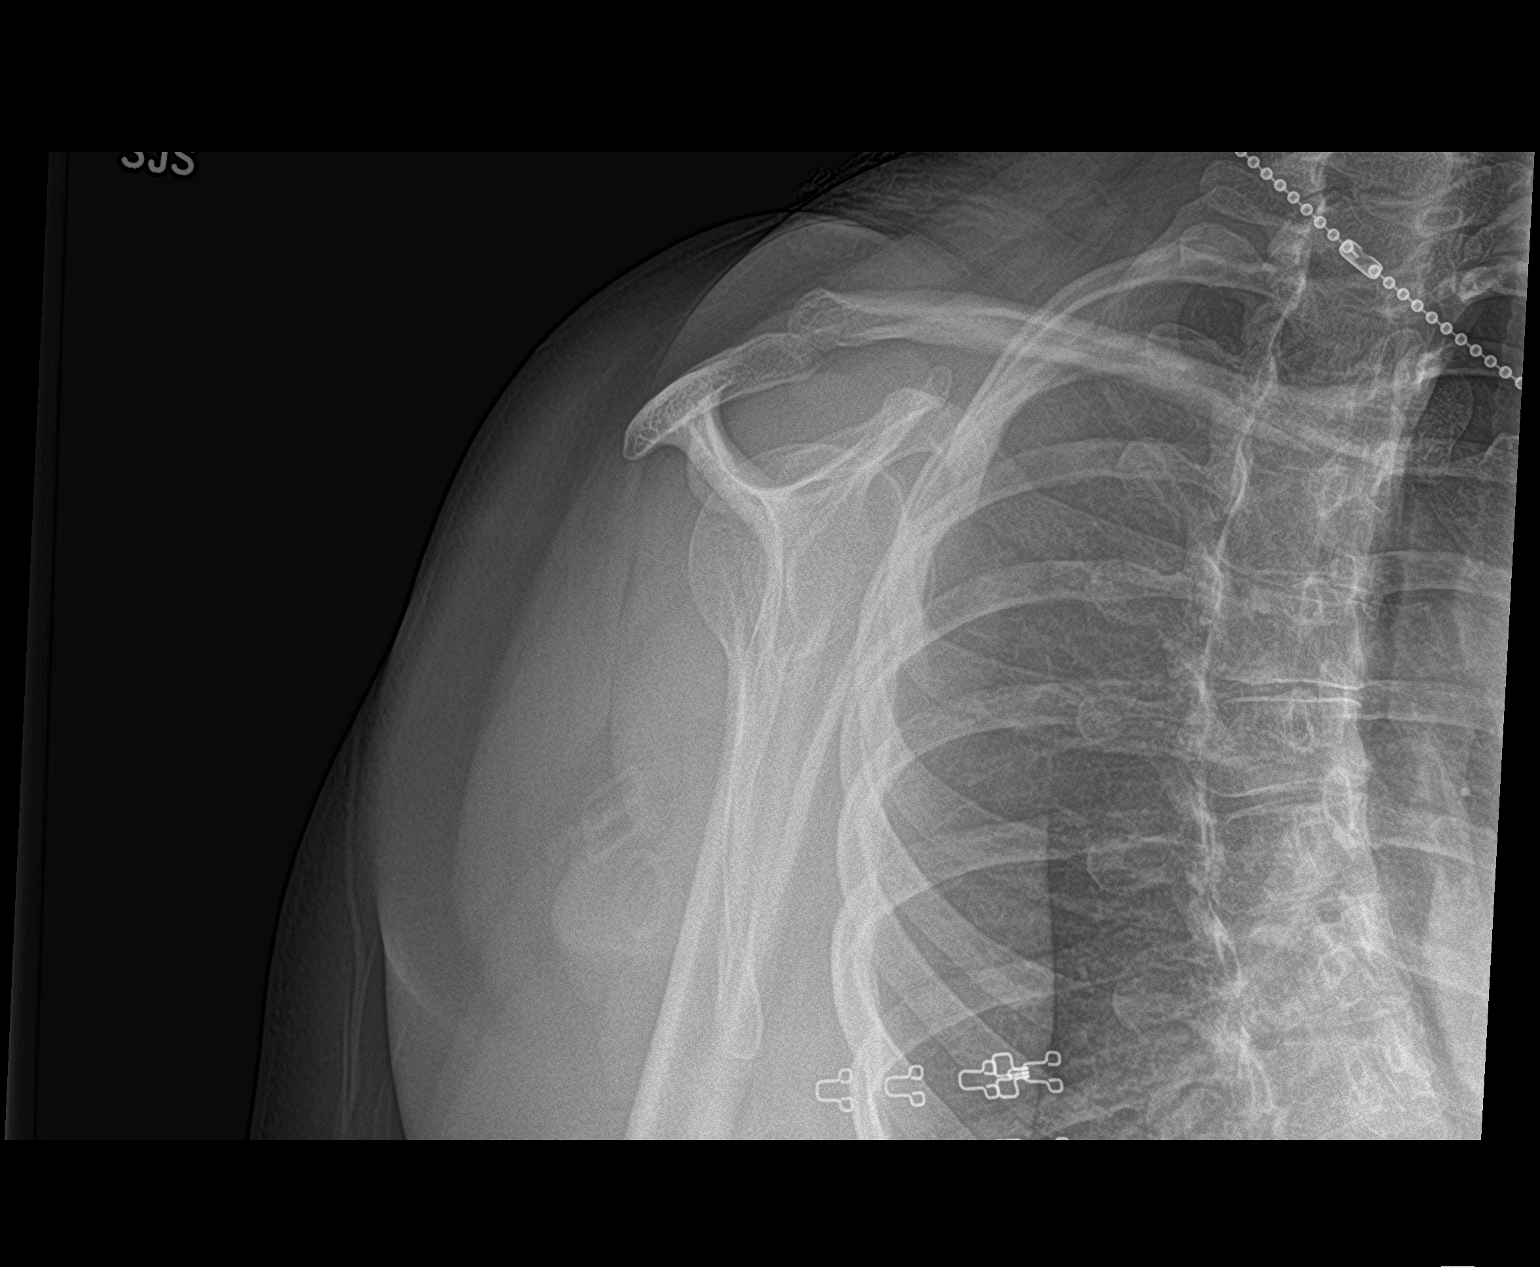

[2 of 2 positions shown; findings below may reference images not displayed]

FINDINGS: There is no evidence of fracture or dislocation. There is no
evidence of arthropathy or other focal bone abnormality. Soft
tissues are unremarkable. Included portion of the right chest is
unremarkable.
IMPRESSION: Negative.

## 2022-01-23 LAB — HM MAMMOGRAPHY

## 2022-02-08 ENCOUNTER — Inpatient Hospital Stay: Admission: RE | Admit: 2022-02-08 | Payer: BC Managed Care – PPO | Source: Ambulatory Visit

## 2022-02-08 ENCOUNTER — Other Ambulatory Visit: Payer: Self-pay

## 2022-02-08 DIAGNOSIS — Z1231 Encounter for screening mammogram for malignant neoplasm of breast: Secondary | ICD-10-CM

## 2022-02-12 ENCOUNTER — Other Ambulatory Visit: Payer: Self-pay | Admitting: Family Medicine

## 2022-02-12 ENCOUNTER — Ambulatory Visit
Admission: RE | Admit: 2022-02-12 | Discharge: 2022-02-12 | Disposition: A | Discharge status: Home / Self Care | Payer: BC Managed Care – PPO | Source: Ambulatory Visit

## 2022-02-12 DIAGNOSIS — Z1231 Encounter for screening mammogram for malignant neoplasm of breast: Secondary | ICD-10-CM

## 2022-10-11 DIAGNOSIS — M797 Fibromyalgia: Secondary | ICD-10-CM | POA: Diagnosis not present

## 2023-07-14 DIAGNOSIS — M654 Radial styloid tenosynovitis [de Quervain]: Secondary | ICD-10-CM | POA: Diagnosis not present

## 2023-07-29 DIAGNOSIS — M654 Radial styloid tenosynovitis [de Quervain]: Secondary | ICD-10-CM | POA: Diagnosis not present

## 2023-08-04 ENCOUNTER — Encounter (HOSPITAL_COMMUNITY): Payer: Self-pay

## 2023-08-11 ENCOUNTER — Other Ambulatory Visit: Payer: Self-pay | Admitting: Medical Genetics

## 2023-08-11 ENCOUNTER — Ambulatory Visit (INDEPENDENT_AMBULATORY_CARE_PROVIDER_SITE_OTHER): Admitting: General Practice

## 2023-08-11 VITALS — BP 118/80 | HR 94 | Temp 98.2°F | Ht 65.25 in | Wt 208.0 lb

## 2023-08-11 DIAGNOSIS — Z114 Encounter for screening for human immunodeficiency virus [HIV]: Secondary | ICD-10-CM

## 2023-08-11 DIAGNOSIS — M069 Rheumatoid arthritis, unspecified: Secondary | ICD-10-CM

## 2023-08-11 DIAGNOSIS — E282 Polycystic ovarian syndrome: Secondary | ICD-10-CM

## 2023-08-11 DIAGNOSIS — M797 Fibromyalgia: Secondary | ICD-10-CM | POA: Diagnosis not present

## 2023-08-11 DIAGNOSIS — E119 Type 2 diabetes mellitus without complications: Secondary | ICD-10-CM | POA: Diagnosis not present

## 2023-08-11 DIAGNOSIS — Z1159 Encounter for screening for other viral diseases: Secondary | ICD-10-CM

## 2023-08-11 DIAGNOSIS — Z7689 Persons encountering health services in other specified circumstances: Secondary | ICD-10-CM | POA: Insufficient documentation

## 2023-08-11 DIAGNOSIS — K219 Gastro-esophageal reflux disease without esophagitis: Secondary | ICD-10-CM | POA: Diagnosis not present

## 2023-08-11 LAB — CBC
HCT: 38.7 % (ref 36.0–46.0)
Hemoglobin: 12.6 g/dL (ref 12.0–15.0)
MCHC: 32.7 g/dL (ref 30.0–36.0)
MCV: 89.3 fl (ref 78.0–100.0)
Platelets: 345 10*3/uL (ref 150.0–400.0)
RBC: 4.33 Mil/uL (ref 3.87–5.11)
RDW: 14.9 % (ref 11.5–15.5)
WBC: 7.5 10*3/uL (ref 4.0–10.5)

## 2023-08-11 LAB — COMPREHENSIVE METABOLIC PANEL WITH GFR
ALT: 28 U/L (ref 0–35)
AST: 22 U/L (ref 0–37)
Albumin: 4.5 g/dL (ref 3.5–5.2)
Alkaline Phosphatase: 88 U/L (ref 39–117)
BUN: 11 mg/dL (ref 6–23)
CO2: 27 meq/L (ref 19–32)
Calcium: 9.1 mg/dL (ref 8.4–10.5)
Chloride: 104 meq/L (ref 96–112)
Creatinine, Ser: 0.77 mg/dL (ref 0.40–1.20)
GFR: 94.95 mL/min (ref >60.00)
Glucose, Bld: 153 mg/dL — ABNORMAL HIGH (ref 70–99)
Potassium: 4.7 meq/L (ref 3.5–5.1)
Sodium: 139 meq/L (ref 135–145)
Total Bilirubin: 0.3 mg/dL (ref 0.2–1.2)
Total Protein: 7.3 g/dL (ref 6.0–8.3)

## 2023-08-11 LAB — LIPID PANEL
Cholesterol: 191 mg/dL (ref 0–200)
HDL: 32.7 mg/dL — ABNORMAL LOW (ref >39.00)
LDL Cholesterol: 126 mg/dL — ABNORMAL HIGH (ref 0–99)
NonHDL: 158.66
Total CHOL/HDL Ratio: 6
Triglycerides: 164 mg/dL — ABNORMAL HIGH (ref 0.0–149.0)
VLDL: 32.8 mg/dL (ref 0.0–40.0)

## 2023-08-11 LAB — MICROALBUMIN / CREATININE URINE RATIO
Creatinine,U: 237.9 mg/dL
Microalb Creat Ratio: 5.4 mg/g (ref 0.0–30.0)
Microalb, Ur: 1.3 mg/dL (ref 0.0–1.9)

## 2023-08-11 LAB — HEMOGLOBIN A1C: Hgb A1c MFr Bld: 8.2 % — ABNORMAL HIGH (ref 4.6–6.5)

## 2023-08-11 MED ORDER — LANCET DEVICE MISC: 1.0000 | Freq: Three times a day (TID) | 0 refills | Status: AC

## 2023-08-11 MED ORDER — LANCETS MISC. MISC: 1.0000 | Freq: Three times a day (TID) | 0 refills | Status: AC

## 2023-08-11 MED ORDER — BLOOD GLUCOSE MONITORING SUPPL DEVI: 1.0000 | Freq: Three times a day (TID) | 0 refills | Status: AC

## 2023-08-11 MED ORDER — BLOOD GLUCOSE TEST VI STRP: 1.0000 | ORAL_STRIP | Freq: Three times a day (TID) | 0 refills | Status: DC

## 2023-08-11 MED ORDER — PANTOPRAZOLE SODIUM 20 MG PO TBEC: 20.0000 mg | DELAYED_RELEASE_TABLET | Freq: Every day | ORAL | 0 refills | Status: DC

## 2023-08-11 NOTE — Assessment & Plan Note (Signed)
 Chronic.  Controlled with Gabapentin 600 mg TID and Tramadol 50 mg PRN. She will update when she needs refill.   Referral placed for pain clinic.

## 2023-08-11 NOTE — Addendum Note (Signed)
 Addended by: Clarance Cristal B on: 08/11/2023 10:15 AM   Modules accepted: Orders

## 2023-08-11 NOTE — Progress Notes (Signed)
 New Patient Office Visit  Subjective    Patient ID: Brandi Parrish, female    DOB: [birth year]  Age: 43 y.o. MRN: [id number]  CC:  Chief Complaint  Patient presents with   New Patient (Initial Visit)    HPI Brandi Parrish is a 43 y.o. female presents to establish care.   Previous PCP/physical/labs: Dorrene Gaucher; [facility]. 2023 for last physical and labs.  Type 2 DM: diagnosed many years ago. She has not been off of her Metformin for a while. She also tried Ozempic and stopped it due to side effects. Lost 40 lbs in two months with no appetite. She was following with endocrinologist, whom she hasn't seen in years. Her last A1c in 2023 was 6.7. She was on prednisone  three weeks ago and had cortisone shot two weeks ago for tendonitis.   GERD: diagnosed many years ago. She has been managed on Nexium 40 mg once daily. She was using the OTC which is not working as well as the prescription one.   Rheumatoid arthritis: diagnosed in 2014. It has been in her knees and fingers and hands. She saw a rheumatology once in 2014 and was given medication. She does not recall the name but she did not take it. She is interested in a rheumatology referral.  Fibromyalgia: diagnosed 2008 in [place] . She has been managed with Gabapentin 600 mg TID. She has been getting her medication from her previous PCP. Gabapentin has been effective with fibromyalgia symptoms. Overall, doing ok. She also takes Tramadol  50 mg as needed.   Outpatient Encounter Medications as of 08/11/2023  Medication Sig   Blood Glucose Monitoring Suppl DEVI 1 each by Does not apply route in the morning, at noon, and at bedtime. May substitute to any manufacturer covered by patient's insurance.   gabapentin (NEURONTIN) 300 MG capsule Take 600 mg by mouth 3 (three) times daily.   Glucose Blood (BLOOD GLUCOSE TEST STRIPS) STRP 1 each by In Vitro route in the morning, at noon, and at bedtime. May substitute to any manufacturer  covered by patient's insurance.   Lancet Device MISC 1 each by Does not apply route in the morning, at noon, and at bedtime. May substitute to any manufacturer covered by patient's insurance.   Lancets Misc. MISC 1 each by Does not apply route in the morning, at noon, and at bedtime. May substitute to any manufacturer covered by patient's insurance.   pantoprazole  (PROTONIX ) 20 MG tablet Take 1 tablet (20 mg total) by mouth daily.   traMADol (ULTRAM) 50 MG tablet Take by mouth every 6 (six) hours as needed.   [DISCONTINUED] esomeprazole (NEXIUM) 40 MG capsule Take 20 mg by mouth daily at 12 noon.    [DISCONTINUED] albuterol  (PROVENTIL  HFA;VENTOLIN  HFA) 108 (90 Base) MCG/ACT inhaler Inhale 2 puffs into the lungs every 4 (four) hours as needed for wheezing or shortness of breath.   [DISCONTINUED] loratadine  (CLARITIN ) 10 MG tablet Take 1 tablet (10 mg total) by mouth daily.   [DISCONTINUED] metFORMIN (GLUCOPHAGE-XR) 500 MG 24 hr tablet Take 500 mg by mouth daily with breakfast.   [DISCONTINUED] methocarbamol (ROBAXIN) 750 MG tablet Take 750 mg by mouth 4 (four) times daily.   [DISCONTINUED] Methylsulfonylmethane 500 MG CAPS Take 500 mg by mouth.   [DISCONTINUED] omeprazole (PRILOSEC) 40 MG capsule Take 40 mg by mouth daily.   [DISCONTINUED] phentermine 37.5 MG capsule Take 37.5 mg by mouth every morning.   [DISCONTINUED] Semaglutide (OZEMPIC, 0.25 OR 0.5 MG/DOSE, [place]) Inject  into the skin.   [DISCONTINUED] spironolactone (ALDACTONE) 100 MG tablet Take 50 mg by mouth daily.    No facility-administered encounter medications on file as of 08/11/2023.    Past Medical History:  Diagnosis Date   Arthritis    autoimmune rheumatoid arthritis   Depression    Fibromyalgia    GERD (gastroesophageal reflux disease)    History of chicken pox    PCOS (polycystic ovarian syndrome)     Past Surgical History:  Procedure Laterality Date   CESAREAN SECTION  2009   CESAREAN SECTION  2012   TUBAL LIGATION   2012    Family History  Problem Relation Age of Onset   Arthritis Mother        spondylitis   Heart disease Father    Diabetes Maternal Uncle    Arthritis Maternal Grandmother    Arthritis Maternal Grandfather    Arthritis Paternal Grandmother        RA   Arthritis Paternal Grandfather    Hyperlipidemia Paternal Grandfather    Hypertension Paternal Grandfather    Stroke Paternal Grandfather    Kidney disease Paternal Grandfather    Heart disease Paternal Grandfather    Breast cancer Neg Hx     Social History   Socioeconomic History   Marital status: Married    Spouse name: Not on file   Number of children: 3   Years of education: Not on file   Highest education level: Some college, no degree  Occupational History    Employer: [employer]  Tobacco Use   Smoking status: Never   Smokeless tobacco: Never  Vaping Use   Vaping status: Never Used  Substance and Sexual Activity   Alcohol use: Yes    Comment: Only when I'm out singing karaoke. Around twice a month   Drug use: No   Sexual activity: Yes    Birth control/protection: Surgical  Other Topics Concern   Not on file  Social History Narrative   Works as an Print production planner for [employer]   Completed 12 grade   3 sons- 2003, 2009, 2012   Enjoys reading, Diplomatic Services operational officer         Social Drivers of Health   Financial Resource Strain: Medium Risk (08/11/2023)   Overall Financial Resource Strain (CARDIA)    Difficulty of Paying Living Expenses: Somewhat hard  Food Insecurity: Food Insecurity Present (08/11/2023)   Hunger Vital Sign    Worried About Running Out of Food in the Last Year: Sometimes true    Ran Out of Food in the Last Year: Sometimes true  Transportation Needs: No Transportation Needs (08/11/2023)   PRAPARE - Administrator, Civil Service (Medical): No    Lack of Transportation (Non-Medical): No  Physical Activity: Unknown (08/11/2023)   Exercise Vital Sign    Days of  Exercise per Week: 0 days    Minutes of Exercise per Session: Not on file  Stress: Stress Concern Present (08/11/2023)   [employer] of Occupational Health - Occupational Stress Questionnaire    Feeling of Stress : To some extent  Social Connections: Socially Isolated (08/11/2023)   Social Connection and Isolation Panel [NHANES]    Frequency of Communication with Friends and Family: Once a week    Frequency of Social Gatherings with Friends and Family: Once a week    Attends Religious Services: Never    Database administrator or Organizations: No    Attends Banker Meetings:  Not on file    Marital Status: Married  Intimate Partner Violence: Unknown (06/25/2021)   [facility]   HITS    Physically Hurt: Not on file    Insult or Talk Down To: Not on file    Threaten Physical Harm: Not on file    Scream or Curse: Not on file    Review of Systems  Constitutional:  Positive for malaise/fatigue. Negative for chills and fever.  Respiratory:  Negative for shortness of breath.   Cardiovascular:  Negative for chest pain.  Gastrointestinal:  Negative for abdominal pain, constipation, diarrhea, heartburn, nausea and vomiting.  Genitourinary:  Negative for dysuria, frequency and urgency.  Neurological:  Negative for dizziness and headaches.  Endo/Heme/Allergies:  Negative for polydipsia.  Psychiatric/Behavioral:  Negative for depression and suicidal ideas. The patient is not nervous/anxious.         Objective    BP 118/80 (BP Location: Left Arm, Patient Position: Sitting, Cuff Size: Normal)   Pulse 94   Temp 98.2 F (36.8 C) (Oral)   Ht 5' 5.25" (1.657 m)   Wt 208 lb (94.3 kg)   SpO2 98%   BMI 34.35 kg/m   Physical Exam Vitals and nursing note reviewed.  Constitutional:      Appearance: Normal appearance.  Cardiovascular:     Rate and Rhythm: Normal rate and regular rhythm.     Pulses: Normal pulses.     Heart sounds: Normal heart  sounds.  Pulmonary:     Effort: Pulmonary effort is normal.     Breath sounds: Normal breath sounds.  Neurological:     Mental Status: She is alert and oriented to person, place, and time.  Psychiatric:        Mood and Affect: Mood normal.        Behavior: Behavior normal.        Thought Content: Thought content normal.        Judgment: Judgment normal.         Assessment & Plan:  Type 2 diabetes mellitus without complication, without long-term current use of insulin (HCC) Assessment & Plan: Uncontrolled.  Last A1c 6.7 in 2023. Has been off of medication for over a year.   Has seen endocrinology in the past. Has tried Metformin and Ozempic in the past. Stopped both due to side effects.   Rx sent for glucometer. Patient advised to check blood sugars and bring back to next appt. Log provided.  Repeat hemoglobin A1c pending.  Urine acr pending.  CMP and CBC pending.  Lipid panel pending.  F/u in 4 weeks.  Orders: -     Comprehensive metabolic panel with GFR -     Hemoglobin A1c -     Microalbumin / creatinine urine ratio -     CBC -     Lipid panel -     Blood Glucose Monitoring Suppl; 1 each by Does not apply route in the morning, at noon, and at bedtime. May substitute to any manufacturer covered by patient's insurance.  Dispense: 1 each; Refill: 0 -     Blood Glucose Test; 1 each by In Vitro route in the morning, at noon, and at bedtime. May substitute to any manufacturer covered by patient's insurance.  Dispense: 90 each; Refill: 0 -     Lancet Device; 1 each by Does not apply route in the morning, at noon, and at bedtime. May substitute to any manufacturer covered by patient's insurance.  Dispense: 1  each; Refill: 0 -     Lancets Misc.; 1 each by Does not apply route in the morning, at noon, and at bedtime. May substitute to any manufacturer covered by patient's insurance.  Dispense: 100 each; Refill: 0  Establishing care with new doctor, encounter for Assessment &  Plan: EMR reviewed briefly.    Gastroesophageal reflux disease, unspecified whether esophagitis present Assessment & Plan: Uncontrolled.  Discussed triggers.   Discontinue nexium.  Start Pantoprazole  20 mg once daily. Rx sent.  F/u in 4 weeks.  Orders: -     Pantoprazole  Sodium; Take 1 tablet (20 mg total) by mouth daily.  Dispense: 30 tablet; Refill: 0  Rheumatoid arthritis involving multiple sites, unspecified whether rheumatoid factor present [facility]) Assessment & Plan: Uncontrolled.  Has seen rheumatologist once before and never started medication that was given.   Discussed medication adherence.   Referral placed for rheumatology.   Orders: -     Ambulatory referral to Rheumatology -     Ambulatory referral to Pain Clinic  Fibromyalgia Assessment & Plan: Chronic.  Controlled with Gabapentin 600 mg TID and Tramadol 50 mg PRN. She will update when she needs refill.   Referral placed for pain clinic.   Orders: -     Ambulatory referral to Pain Clinic  Polycystic ovarian syndrome Assessment & Plan: Controlled.   TSH pending.  Orders: -     Hemoglobin A1c -     Lipid panel -     TSH  Need for hepatitis C screening test -     Hepatitis C antibody  Screening for HIV (human immunodeficiency virus) -     HIV Antibody (routine testing w rflx)     Return in about 4 weeks (around 09/08/2023) for GERD, DM. Jolanda Nation, NP

## 2023-08-11 NOTE — Assessment & Plan Note (Signed)
 Controlled.   TSH pending.

## 2023-08-11 NOTE — Assessment & Plan Note (Addendum)
 Uncontrolled.  Last A1c 6.7 in 2023. Has been off of medication for over a year.   Has seen endocrinology in the past. Has tried Metformin and Ozempic in the past. Stopped both due to side effects.   Rx sent for glucometer. Patient advised to check blood sugars and bring back to next appt. Log provided.  Repeat hemoglobin A1c pending.  Urine acr pending.  CMP and CBC pending.  Lipid panel pending.  F/u in 4 weeks.

## 2023-08-11 NOTE — Assessment & Plan Note (Signed)
 EMR reviewed briefly.

## 2023-08-11 NOTE — Assessment & Plan Note (Signed)
 Uncontrolled.  Has seen rheumatologist once before and never started medication that was given.   Discussed medication adherence.   Referral placed for rheumatology.

## 2023-08-11 NOTE — Assessment & Plan Note (Signed)
 Uncontrolled.  Discussed triggers.   Discontinue nexium.  Start Pantoprazole  20 mg once daily. Rx sent.  F/u in 4 weeks.

## 2023-08-11 NOTE — Patient Instructions (Addendum)
 Stop by the lab prior to leaving today. I will notify you of your results once received.   Pantoprazole  20 mg one tablet before dinner.   Schedule diabetic eye exam.   Start checking your blood sugar levels.  Appropriate times to check your blood sugar levels are:  -Before any meal (breakfast, lunch, dinner) -Two hours after any meal (breakfast, lunch, dinner) -Bedtime  Record your readings and notify me if you continue to consistently run at or above 150.    Follow up in 4 weeks.   It was a pleasure to meet you today! Please don't hesitate to contact me with any questions. Welcome to [facility]!

## 2023-08-12 ENCOUNTER — Ambulatory Visit: Payer: Self-pay | Admitting: General Practice

## 2023-08-12 DIAGNOSIS — E119 Type 2 diabetes mellitus without complications: Secondary | ICD-10-CM

## 2023-08-12 DIAGNOSIS — E1169 Type 2 diabetes mellitus with other specified complication: Secondary | ICD-10-CM

## 2023-08-12 LAB — HEPATITIS C ANTIBODY: Hepatitis C Ab: NONREACTIVE

## 2023-08-12 LAB — TSH: TSH: 0.7 u[IU]/mL (ref 0.35–5.50)

## 2023-08-12 LAB — HIV ANTIBODY (ROUTINE TESTING W REFLEX): HIV 1&2 Ab, 4th Generation: NONREACTIVE

## 2023-08-13 ENCOUNTER — Other Ambulatory Visit (HOSPITAL_BASED_OUTPATIENT_CLINIC_OR_DEPARTMENT_OTHER): Payer: Self-pay

## 2023-08-13 MED ORDER — TIRZEPATIDE 2.5 MG/0.5ML ~~LOC~~ SOAJ
2.5000 mg | SUBCUTANEOUS | 0 refills | Status: DC
  Filled 2023-08-13: qty 2, 28d supply, fill #0

## 2023-08-13 MED ORDER — ROSUVASTATIN CALCIUM 5 MG PO TABS
5.0000 mg | ORAL_TABLET | Freq: Every day | ORAL | 1 refills | Status: DC
  Filled 2023-08-13: qty 90, 90d supply, fill #0

## 2023-08-14 ENCOUNTER — Other Ambulatory Visit
Admission: RE | Admit: 2023-08-14 | Discharge: 2023-08-14 | Disposition: A | Discharge status: Home / Self Care | Payer: Self-pay | Source: Ambulatory Visit | Attending: Medical Genetics | Admitting: Medical Genetics

## 2023-08-24 LAB — GENECONNECT MOLECULAR SCREEN: Genetic Analysis Overall Interpretation: NEGATIVE

## 2023-08-26 ENCOUNTER — Ambulatory Visit: Admitting: General Practice

## 2023-08-29 ENCOUNTER — Ambulatory Visit: Admitting: General Practice

## 2023-09-07 ENCOUNTER — Other Ambulatory Visit: Payer: Self-pay | Admitting: General Practice

## 2023-09-07 DIAGNOSIS — E119 Type 2 diabetes mellitus without complications: Secondary | ICD-10-CM

## 2023-09-07 DIAGNOSIS — K219 Gastro-esophageal reflux disease without esophagitis: Secondary | ICD-10-CM

## 2023-09-08 ENCOUNTER — Ambulatory Visit (INDEPENDENT_AMBULATORY_CARE_PROVIDER_SITE_OTHER): Admitting: General Practice

## 2023-09-08 ENCOUNTER — Encounter: Payer: Self-pay | Admitting: General Practice

## 2023-09-08 VITALS — BP 116/76 | HR 87 | Temp 98.4°F | Ht 65.25 in | Wt 205.0 lb

## 2023-09-08 DIAGNOSIS — Z1231 Encounter for screening mammogram for malignant neoplasm of breast: Secondary | ICD-10-CM

## 2023-09-08 DIAGNOSIS — K219 Gastro-esophageal reflux disease without esophagitis: Secondary | ICD-10-CM | POA: Diagnosis not present

## 2023-09-08 DIAGNOSIS — E782 Mixed hyperlipidemia: Secondary | ICD-10-CM | POA: Insufficient documentation

## 2023-09-08 DIAGNOSIS — Z124 Encounter for screening for malignant neoplasm of cervix: Secondary | ICD-10-CM | POA: Diagnosis not present

## 2023-09-08 DIAGNOSIS — E119 Type 2 diabetes mellitus without complications: Secondary | ICD-10-CM

## 2023-09-08 DIAGNOSIS — F32A Depression, unspecified: Secondary | ICD-10-CM

## 2023-09-08 DIAGNOSIS — M797 Fibromyalgia: Secondary | ICD-10-CM

## 2023-09-08 MED ORDER — MOUNJARO 5 MG/0.5ML ~~LOC~~ SOAJ: 5.0000 mg | SUBCUTANEOUS | 1 refills | Status: DC

## 2023-09-08 MED ORDER — BLOOD GLUCOSE TEST VI STRP: 1.0000 | ORAL_STRIP | Freq: Three times a day (TID) | 1 refills | Status: DC

## 2023-09-08 MED ORDER — ESOMEPRAZOLE MAGNESIUM 20 MG PO CPDR: 20.0000 mg | DELAYED_RELEASE_CAPSULE | Freq: Every day | ORAL | 1 refills | Status: DC

## 2023-09-08 NOTE — Assessment & Plan Note (Signed)
 Controlled.  Resumed Nexium 20 mg once daily. Rx sent.

## 2023-09-08 NOTE — Assessment & Plan Note (Signed)
 Controlled.  Continue Gabapentin 600 mg TID and Tramadol 50 mg PRN.

## 2023-09-08 NOTE — Assessment & Plan Note (Signed)
 Blood sugars at home range between 100-125.  Overall doing better.  No side effects of Mounjaro .   Increase Mounjaro  from 2.5 mg once weekly to 5 mg once weekly. Rx sent.  Foot exam completed today.  Eye exam completed- request sent to retrieve records.  Urine ACR- UTD.  Declines pneumonia vaccine.  Crestor  5 mg for CAD protection.   F/u in 2 months.

## 2023-09-08 NOTE — Progress Notes (Signed)
 Established Patient Office Visit  Subjective   Patient ID: Brandi Parrish, female    DOB: 22-Dec-1980  Age: 43 y.o. MRN: [id number]  Chief Complaint  Patient presents with   Medical Management of Chronic Issues    HPI  Brandi Parrish is a 43 year old female with past medical history of migraine, GERD, PCOS, type 2 DM, RA, psoriasis, depression, fibromyalgia, Hirsutism presents today for a follow up.   DM2: Her hemoglobin A1c was 8.2. she was restarted on Mounjaro  2.5 mg once weekly.  GERD: evaluated on 08/11/23 and was restarted on Pantoprazole  20 mg once daily. Today she reports that it worked for about two weeks but it was not effective. She restarted Nexium 20 mg once daily. She has been taking it daily and has found that it has been effective. She has been purchasing it over the counter but would like to see if we can send it in as a prescription. Only time the medication has not been effective is when she eats any of her trigger foods like tacos.   RA: scheduled to see rheumatology in July. Overall symptoms has been controlled. Currently managed on Gabapentin 600 mg three times a day and tramadol as needed. She has not had to use the Tramadol since gabapentin has been effective.   Patient Active Problem List   Diagnosis Date Noted   Mixed hyperlipidemia 09/08/2023   Screening for cervical cancer 09/08/2023   Fibromyalgia 08/11/2023   Type 2 diabetes mellitus without complication, without long-term current use of insulin (HCC) 04/27/2019   Polycystic ovarian syndrome 06/02/2017   Abnormality of hormone 04/12/2017   Acute low back pain with radicular symptoms, duration less than 6 weeks 03/08/2013   Psoriasis 03/08/2013   Routine general medical examination at a health care facility 09/01/2012   Rheumatoid arthritis (HCC) 07/18/2012   Hirsutism 07/18/2012   GERD (gastroesophageal reflux disease) 07/18/2012   Depression 07/18/2012   Migraine 07/18/2012   Past Medical History:   Diagnosis Date   Arthritis    autoimmune rheumatoid arthritis   Depression    Fibromyalgia    GERD (gastroesophageal reflux disease)    History of chicken pox    Migraines    PCOS (polycystic ovarian syndrome)    Past Surgical History:  Procedure Laterality Date   CESAREAN SECTION  2009   CESAREAN SECTION  2012   TUBAL LIGATION  2012   No Known Allergies       09/08/2023    3:21 PM 08/11/2023    9:44 AM  Depression screen PHQ 2/9  Decreased Interest 1 0  Down, Depressed, Hopeless 1 1  PHQ - 2 Score 2 1  Altered sleeping 3 3  Tired, decreased energy 3 3  Change in appetite 2 2  Feeling bad or failure about yourself  0 1  Trouble concentrating 1 0  Moving slowly or fidgety/restless 1 0  Suicidal thoughts 0 0  PHQ-9 Score 12 10  Difficult doing work/chores Somewhat difficult Somewhat difficult       09/08/2023    3:21 PM 08/11/2023    9:44 AM  GAD 7 : Generalized Anxiety Score  Nervous, Anxious, on Edge 1 0  Control/stop worrying 0 1  Worry too much - different things 0 0  Trouble relaxing 1 1  Restless 1 0  Easily annoyed or irritable 2 2  Afraid - awful might happen 0 0  Total GAD 7 Score 5 4  Anxiety Difficulty Somewhat difficult Somewhat difficult  Review of Systems  Constitutional:  Negative for chills and fever.  Respiratory:  Negative for shortness of breath.   Cardiovascular:  Negative for chest pain.  Gastrointestinal:  Negative for abdominal pain, constipation, diarrhea, heartburn, nausea and vomiting.  Genitourinary:  Negative for dysuria, frequency and urgency.  Neurological:  Negative for dizziness and headaches.  Endo/Heme/Allergies:  Negative for polydipsia.  Psychiatric/Behavioral:  Negative for depression and suicidal ideas. The patient is not nervous/anxious.       Objective:     BP 116/76   Pulse 87   Temp 98.4 F (36.9 C) (Oral)   Ht 5' 5.25 (1.657 m)   Wt 205 lb (93 kg)   LMP 09/06/2023 (Exact Date)   SpO2 98%   BMI  33.85 kg/m  BP Readings from Last 3 Encounters:  09/08/23 116/76  08/11/23 118/80  01/28/19 123/90   Wt Readings from Last 3 Encounters:  09/08/23 205 lb (93 kg)  08/11/23 208 lb (94.3 kg)  01/28/19 190 lb (86.2 kg)      Physical Exam Vitals and nursing note reviewed.  Constitutional:      Appearance: Normal appearance.   Cardiovascular:     Rate and Rhythm: Normal rate and regular rhythm.     Pulses: Normal pulses.     Heart sounds: Normal heart sounds.  Pulmonary:     Effort: Pulmonary effort is normal.     Breath sounds: Normal breath sounds.   Neurological:     Mental Status: She is alert and oriented to person, place, and time.   Psychiatric:        Mood and Affect: Mood normal.        Behavior: Behavior normal.        Thought Content: Thought content normal.        Judgment: Judgment normal.      No results found for any visits on 09/08/23.     The 10-year ASCVD risk score (Arnett DK, et al., 2019) is: 2.4%    Assessment & Plan:  Gastroesophageal reflux disease, unspecified whether esophagitis present Assessment & Plan: Controlled.  Resumed Nexium 20 mg once daily. Rx sent.  Orders: -     Esomeprazole Magnesium; Take 1 capsule (20 mg total) by mouth daily at 12 noon.  Dispense: 90 capsule; Refill: 1  Mixed hyperlipidemia Assessment & Plan: Uncontrolled.  Continue Crestor  5 mg once daily.   Hepatic function panel pending.  Orders: -     Hepatic Function Panel  Type 2 diabetes mellitus without complication, without long-term current use of insulin (HCC) Assessment & Plan: Blood sugars at home range between 100-125.  Overall doing better.  No side effects of Mounjaro .   Increase Mounjaro  from 2.5 mg once weekly to 5 mg once weekly. Rx sent.  Foot exam completed today.  Eye exam completed- request sent to retrieve records.  Urine ACR- UTD.  Declines pneumonia vaccine.  Crestor  5 mg for CAD protection.   F/u in 2 months.  Orders: -      Mounjaro ; Inject 5 mg into the skin once a week.  Dispense: 2 mL; Refill: 1 -     Blood Glucose Test; 1 each by In Vitro route in the morning, at noon, and at bedtime. May substitute to any manufacturer covered by patient's insurance.  Dispense: 90 each; Refill: 1  Screening for cervical cancer Assessment & Plan: Referral placed to establish with gyn for pap smear.  Orders: -     Ambulatory referral to Gynecology  Encounter for screening mammogram for malignant neoplasm of breast -     3D Screening Mammogram, Left and Right; Future  Depression, unspecified depression type Assessment & Plan: Controlled.   Her previous PCP started her on Cymbalta but patient had given her a prescription and patient was having insurance issues.   She started Cymbalta 30 mg once daily. Tolerating it well.    Fibromyalgia Assessment & Plan: Controlled.  Continue Gabapentin 600 mg TID and Tramadol 50 mg PRN.     Return in about 2 months (around 11/08/2023) for diabetes.    Jolanda Nation, NP

## 2023-09-08 NOTE — Patient Instructions (Addendum)
 Stop by the lab prior to leaving today. I will notify you of your results once received.   Mammogram scheduled.   You will either be contacted via phone regarding your referral to gyn , or you may receive a letter on your MyChart portal from our referral team with instructions for scheduling an appointment. Please let us  know if you have not been contacted by anyone within two weeks.  Start tirzepitide (Mounjaro ) for diabetes/weight loss. Increase to 5 mg weekly for two months.   F/u in two months.

## 2023-09-08 NOTE — Assessment & Plan Note (Signed)
 Referral placed to establish with gyn for pap smear.

## 2023-09-08 NOTE — Assessment & Plan Note (Signed)
 Controlled.   Her previous PCP started her on Cymbalta but patient had given her a prescription and patient was having insurance issues.   She started Cymbalta 30 mg once daily. Tolerating it well.

## 2023-09-08 NOTE — Assessment & Plan Note (Signed)
 Uncontrolled.  Continue Crestor  5 mg once daily.   Hepatic function panel pending.

## 2023-09-09 ENCOUNTER — Ambulatory Visit: Payer: Self-pay | Admitting: General Practice

## 2023-09-09 LAB — HEPATIC FUNCTION PANEL
AG Ratio: 1.7 (calc) (ref 1.0–2.5)
ALT: 25 U/L (ref 6–29)
AST: 18 U/L (ref 10–30)
Albumin: 4.7 g/dL (ref 3.6–5.1)
Alkaline phosphatase (APISO): 79 U/L (ref 31–125)
Bilirubin, Direct: 0.1 mg/dL (ref 0.0–0.2)
Globulin: 2.8 g/dL (ref 1.9–3.7)
Indirect Bilirubin: 0.3 mg/dL (ref 0.2–1.2)
Total Bilirubin: 0.4 mg/dL (ref 0.2–1.2)
Total Protein: 7.5 g/dL (ref 6.1–8.1)

## 2023-09-09 LAB — EXTRA SPECIMEN

## 2023-09-09 LAB — TEST AUTHORIZATION

## 2023-09-29 ENCOUNTER — Encounter

## 2023-10-06 DIAGNOSIS — L4050 Arthropathic psoriasis, unspecified: Secondary | ICD-10-CM | POA: Diagnosis not present

## 2023-10-06 DIAGNOSIS — Z7960 Long term (current) use of unspecified immunomodulators and immunosuppressants: Secondary | ICD-10-CM | POA: Diagnosis not present

## 2023-10-06 DIAGNOSIS — M47819 Spondylosis without myelopathy or radiculopathy, site unspecified: Secondary | ICD-10-CM | POA: Diagnosis not present

## 2023-10-06 DIAGNOSIS — Z111 Encounter for screening for respiratory tuberculosis: Secondary | ICD-10-CM | POA: Diagnosis not present

## 2023-10-06 DIAGNOSIS — L409 Psoriasis, unspecified: Secondary | ICD-10-CM | POA: Diagnosis not present

## 2023-10-06 DIAGNOSIS — R768 Other specified abnormal immunological findings in serum: Secondary | ICD-10-CM | POA: Diagnosis not present

## 2023-10-06 NOTE — Patient Instructions (Signed)
At [facility], we are working on new initiatives to help you take positive steps to improving your health at home, not just when you are at the doctor's office! One of these steps is to help you set realistic health-improvement goals. Most rheumatologic diseases need medications to keep them under optimal control, so below you will find some tips and tricks to help you remember to take your medications when they are due.     Be Involved in Your Health Care: Taking Medications  When medications are taken as directed, they can greatly improve your health. But if they are not taken as instructed, they may not work. In some cases, not taking them correctly can be harmful. To help ensure your treatment remains effective and safe, understand your medications and how to take them.    Your lab results, notes and after visit summary will be available on my chart. We strongly encourage you to use this feature.   If lab results are abnormal the clinic will contact you with the appropriate steps. If the clinic does not contact you assume the results are satisfactory. You can always see you results on my chart.   If you have questions regarding your condition, please contact the clinic during office hours. You can also ask questions on my chart.     We at [facility] are grateful that you choose [place] to provide care. We strive to provide excellent compassionate care and are always looking for feedback. If you get a survey from the clinic please complete this.

## 2023-10-06 NOTE — Progress Notes (Signed)
 [facility] Rheumatology   HPI  Reason for Consult: Joint Pains, Positive ANA  Chief Complaint  Patient presents with  . Joint Pain     I have been asked to see this patient in consultation by Carrol Aurora. I have reviewed the medical records from [facility]. Brandi Parrish is a 43 y.o. female is here today for evaluation of rheumatoid arthritis who has medical history of GERD, fibromyalgia, psoriasis and PCOS. She has seen Dr.Ziolkowaska in the past. She has psoriasis of the scalp. Her sister has psoriasis. She has no rash anywhere else. She has pain of the fingers, hips and knees. She is able to form a fist fist but does cause pain. She has trouble with forming a fist. She denies any joint aspiration. She does get lower back pain and stiffness. This is worse in the morning as well as after over use. She has no achilles pain or swelling. She takes gabapentin for the fibromyalgia and Tramadol as needed for pains. She does take NSAIDs as needed for pains.    There is no lupus in the family. Her sister has prosiasis and Crohn's. Her paternal grandmother has rheumatoid arthritis and Crohn's. She has no history of gout or Crohn's or Ulcerative colitis.   Patient denies fever,  night sweats, lymphadenopathy, alopecia, dry eyes, iritis/uveitis/scleritis, parotid swelling, malar rash/photosensitive rash, nasal/oral ulcers, hearing loss, dry cough, dyspnea, hemoptysis/epistaxis/recurrent sinus infection, difficulty swallowing, pleurisy, serositis, hematochezia/hematuria, seizures/stroke/DVT/PE/Raynaud's. ______________________________________________________________________    Current Outpatient Medications:  .  betamethasone dipropionate 0.05 % lotion, Apply topically 2 (two) times daily, Disp: , Rfl:  .  DULoxetine (CYMBALTA) 30 MG DR capsule, Take 30 mg by mouth once daily, Disp: , Rfl:  .  esomeprazole  (NEXIUM ) 20 MG DR capsule, Take 20 mg by mouth once daily, Disp: , Rfl:  .   gabapentin (NEURONTIN) 300 MG capsule, Take 600 mg by mouth 3 (three) times daily, Disp: , Rfl:  .  ibuprofen  (MOTRIN ) 200 MG tablet, Take 200 mg by mouth every 6 (six) hours as needed for Pain, Disp: , Rfl:  .  ketoconazole, micronized (KETOCONAZOLE, MICRO, BULK, MISC), Use 3 (three) times a week, Disp: , Rfl:  .  MOUNJARO  5 mg/0.5 mL pen injector, Inject 5 mg subcutaneously every 7 (seven) days, Disp: , Rfl:  .  naproxen  sodium (ALEVE ) 220 MG tablet, , Disp: , Rfl:  .  rosuvastatin  (CRESTOR ) 5 MG tablet, Take 5 mg by mouth once daily, Disp: , Rfl:  .  traMADoL (ULTRAM) 50 mg tablet, Take 50 mg by mouth every 6 (six) hours as needed for Pain, Disp: , Rfl:   No Known Allergies  Past Medical History:  Diagnosis Date  . GERD (gastroesophageal reflux disease)   . Hirsutism   . PCOS (polycystic ovarian syndrome)   . Prediabetes     Past Surgical History:  Procedure Laterality Date  . LAPAROSCOPIC TUBAL LIGATION      Family History  Problem Relation Name Age of Onset  . Arthritis Mother    . Osteoarthritis Father    . Heart failure Father    . Psoriasis Sister      Social History   Tobacco Use  . Smoking status: Never  . Smokeless tobacco: Never  Substance Use Topics  . Alcohol use: Not on file    ______________________________________________________________________  Review of Systems:  Review of Systems  Constitutional:  Positive for fatigue.  HENT:  Negative for mouth sores and trouble swallowing.  Dry Mouth  Eyes:  Positive for pain. Negative for redness.       Neg: Dry Eyes  Respiratory:  Negative for cough and shortness of breath.   Cardiovascular:  Negative for chest pain and leg swelling.  Gastrointestinal:  Negative for constipation, diarrhea and nausea.       Heartburn  Endocrine: Negative for cold intolerance and heat intolerance.  Genitourinary:  Negative for hematuria.  Musculoskeletal:        Per HPI  Skin:  Negative for color change and rash.   Neurological:  Positive for weakness and headaches. Negative for dizziness and numbness.  Hematological:  Does not bruise/bleed easily.  Psychiatric/Behavioral:  Positive for agitation and sleep disturbance. Negative for dysphoric mood. The patient is nervous/anxious.   All other systems reviewed and are negative.   Objective:  Vitals:   10/06/23 1528  BP: 116/78  Temp: 36.2 C (97.2 F)  TempSrc: Temporal  Weight: 89.4 kg (197 lb)  Height: 160 cm (5' 3)  PainSc:   5     Length of Stiffness: 2-3 hours   GEN - Pleasant, No Apparent Distress  HEENT - normocephalic and atraumatic. Conjunctiva Clear.  No Nasal/Oral Ulcer. Adequate Saliva Neck - supple with no adenopathy or thyromegaly.   C spine with full range of motion. Heart - regular rate and rhythm, No murmurs/gallops/rub, Nml S1S2 Lungs - clear to auscultation in all fields. Extremities - there is no cyanosis or edema. Neurological - alert and oriented.  Spine - no paraspinal tenderness; no lumbar spine tenderness  Skin - Scalp Psoriasis  MSK - The following joints were examined bilaterally: Hands, Wrists, Elbows, Shoulders, Metatarsals, Ankels, Knees and Hips; they were normal apart from what is noted.   100% Fist Formation PIP Enlargement and Tenderness SI Joint Tenderness Both Hips worse of the right on rotation  No Achilles Pain No Synovitis or Dactylitis 02 Tender Point Gait Fluent  ______________________________________________________________________ Labs/Imaging Reviewed in EMR Cr 0.77, AST 18, ALT 25 Normal CBC HgA1C 8.2 (H) Neg: Hep C  --2019-- Normal C3 and C4 Pos: ANA 1:320 Neg: RF and AntiCCP  Hip Xray (2019) Right hip: No bony or joint abnormality. Status post BTL.  Left hip: No bony or joint abnormality. Status post BTL.  Lumbar Spine MRIA (2019): Essentially normal MRI of the lumbar spine without contrast.   Assessment and Plan    Psoriasis with Psoriatic Arthritis Moderate: Active --  Scalp with Psoriasis, PIP joint tenderness, SI joint tenderness, Inflammatory Back Pain -- Hx of Tubal Ligation -- Start Tremfya  -- Check SI Joint Xray  2. Positive ANA -- Check SSA, SSB labs due to dry mouth -- No other features of connective tissue disease  3. Long term use of immunosuppressive therapy -- Biologic therapy discussed at length with patient, including risks, benefits, adverse effects and costs. Literature was provided to the patient.   Discussion included issues such as mode of administration, infusion reaction, injection reactions, infectious disease increased risks, cancer and less common adverse effects. Patient was directed to call office should any adverse reaction develop or if fever or any infection develops.  Patient has been counseled on need for TB testing ( QTB) and increased risk of tuberculosis.    -- Check labs   Diagnoses and all orders for this visit:  Psoriatic arthritis (CMS/HHS-HCC) -     X-ray sacroiliac joints 3 plus views; Future -     Sedimentation Rate-Automated -     C-Reactive Protein, Quant -  [facility]  Psoriasis  Seronegative spondyloarthropathy -     X-ray sacroiliac joints 3 plus views; Future  ANA positive -     Antinuclear Antibodies, IFA - [facility] -     Sjogren's Ab, Anti-SS-A/-SS-B - [facility]  Dry mouth  Long-term use of immunosuppressant medication -     Viral Hepatitis HBV, HCV - [facility]  Tuberculosis screening -     QuantiFERON-TB Gold Plus - [facility]    Return in about 4 months (around 02/06/2024) for Routine Follow Up.  All new prescription medications, changes in current prescription dosages, and sample medications were discussed with the patient, including patient education, medication name, use, dosage, potential side effects, drug interactions, consequences of not using/taking, and special instructions.  Patient expressed understanding.  No barriers to adherence.   I appreciate the opportunity to participate in the  care of [facility]. Please do not hesitate to contact me with any questions or concerns that may arise in regards to the patient's rheumatologic disease.   Attestation Statement:   I personally performed the service. (TP)  MAYUR LOREE BLANCH, MD

## 2023-10-14 ENCOUNTER — Telehealth: Payer: Self-pay | Admitting: General Practice

## 2023-10-14 NOTE — Telephone Encounter (Signed)
 Copied from CRM #[id number]. Topic: General - Call Back - No Documentation >> Oct 14, 2023  3:35 PM Robinson H wrote: Reason for CRM: Patient states she has a message to return call to Joellen no notes or messages, please reach back out.  Sicily [phone]

## 2023-10-20 NOTE — Telephone Encounter (Signed)
 Spoke with patient and advised her of this and she states it may have been rheumatology that called; she was unsure and will give them a call as well.

## 2023-10-20 NOTE — Telephone Encounter (Signed)
 After I have reviewed her chart; no one from our office has called this patient.

## 2023-10-24 ENCOUNTER — Encounter

## 2023-10-29 ENCOUNTER — Encounter: Payer: Self-pay | Admitting: General Practice

## 2023-10-29 DIAGNOSIS — E119 Type 2 diabetes mellitus without complications: Secondary | ICD-10-CM

## 2023-10-29 MED ORDER — MOUNJARO 5 MG/0.5ML ~~LOC~~ SOAJ: 5.0000 mg | SUBCUTANEOUS | 0 refills | Status: DC

## 2023-11-10 ENCOUNTER — Encounter: Payer: Self-pay | Admitting: General Practice

## 2023-11-10 ENCOUNTER — Ambulatory Visit (INDEPENDENT_AMBULATORY_CARE_PROVIDER_SITE_OTHER): Admitting: General Practice

## 2023-11-10 VITALS — BP 122/80 | HR 97 | Temp 98.5°F | Ht 65.25 in | Wt 189.2 lb

## 2023-11-10 DIAGNOSIS — L4050 Arthropathic psoriasis, unspecified: Secondary | ICD-10-CM | POA: Insufficient documentation

## 2023-11-10 DIAGNOSIS — E119 Type 2 diabetes mellitus without complications: Secondary | ICD-10-CM | POA: Diagnosis not present

## 2023-11-10 DIAGNOSIS — M25532 Pain in left wrist: Secondary | ICD-10-CM | POA: Diagnosis not present

## 2023-11-10 DIAGNOSIS — E1169 Type 2 diabetes mellitus with other specified complication: Secondary | ICD-10-CM | POA: Diagnosis not present

## 2023-11-10 DIAGNOSIS — Z7985 Long-term (current) use of injectable non-insulin antidiabetic drugs: Secondary | ICD-10-CM

## 2023-11-10 DIAGNOSIS — E785 Hyperlipidemia, unspecified: Secondary | ICD-10-CM | POA: Diagnosis not present

## 2023-11-10 MED ORDER — TIRZEPATIDE 7.5 MG/0.5ML ~~LOC~~ SOAJ: 7.5000 mg | SUBCUTANEOUS | 1 refills | Status: DC

## 2023-11-10 MED ORDER — ROSUVASTATIN CALCIUM 5 MG PO TABS: 5.0000 mg | ORAL_TABLET | Freq: Every day | ORAL | 1 refills | Status: AC

## 2023-11-10 NOTE — Progress Notes (Addendum)
 Established Patient Office Visit  Subjective   Patient ID: Brandi Parrish, female    DOB: [birth year]  Age: 43 y.o. MRN: [id number]  Chief Complaint  Patient presents with   Diabetes    Patient here today to follow up on DM. Patient is currently taking mounjaro  5 mg weekly.     Diabetes Pertinent negatives for hypoglycemia include no dizziness, headaches or nervousness/anxiousness. Pertinent negatives for diabetes include no chest pain and no polydipsia.    Discussed the use of AI scribe software for clinical note transcription with the patient, who gave verbal consent to proceed.  History of Present Illness Brandi Parrish is a 43 year old female with type 2 diabetes who presents for a follow-up visit. She is accompanied by her husband.  She has been managing her type 2 diabetes with Mounjaro , experiencing significant weight loss from 205 pounds to 189 pounds. She reports mild nausea, bloating, and gas for a day or two after taking the medication, which are less severe than her previous experience with Ozempic. Despite not losing weight in the past month and a half, her clothes fit looser. Her current medications include Mounjaro  5 mg weekly. She uses Gas-X for bloating.  She has a history of psoriatic arthritis, initially misdiagnosed as rheumatoid arthritis, with psoriasis on the back of her head and painful arthritis. She has started Tremfya for her psoriatic arthritis but has not noticed any improvement after the first dose.  She experiences left wrist pain due to tendinitis. A cortisone shot in May provided relief for five to six weeks. Prior treatments with a prednisone  pack and a splint did not alleviate her symptoms.  No fever, chills, chest pain, shortness of breath, nausea, vomiting, diarrhea, dizziness, urinary symptoms, depression, or anxiety. She reports a little constipation and persistent headaches, which have been longstanding without recent changes.   Patient  Active Problem List   Diagnosis Date Noted   Left wrist pain 11/10/2023   Psoriatic arthritis (HCC) 11/10/2023   Mixed hyperlipidemia 09/08/2023   Screening for cervical cancer 09/08/2023   Fibromyalgia 08/11/2023   Type 2 diabetes mellitus without complication, without long-term current use of insulin (HCC) 04/27/2019   Polycystic ovarian syndrome 06/02/2017   Abnormality of hormone 04/12/2017   Acute low back pain with radicular symptoms, duration less than 6 weeks 03/08/2013   Psoriasis 03/08/2013   Routine general medical examination at a health care facility 09/01/2012   Rheumatoid arthritis (HCC) 07/18/2012   Hirsutism 07/18/2012   GERD (gastroesophageal reflux disease) 07/18/2012   Depression 07/18/2012   Migraine 07/18/2012   Past Medical History:  Diagnosis Date   Arthritis    autoimmune rheumatoid arthritis   Depression    Fibromyalgia    GERD (gastroesophageal reflux disease)    History of chicken pox    Migraines    PCOS (polycystic ovarian syndrome)    Past Surgical History:  Procedure Laterality Date   CESAREAN SECTION  2009   CESAREAN SECTION  2012   TUBAL LIGATION  2012   No Known Allergies       11/10/2023    3:44 PM 09/08/2023    3:21 PM 08/11/2023    9:44 AM  Depression screen PHQ 2/9  Decreased Interest 0 1 0  Down, Depressed, Hopeless 0 1 1  PHQ - 2 Score 0 2 1  Altered sleeping 2 3 3   Tired, decreased energy 2 3 3   Change in appetite 1 2 2   Feeling bad or failure about  yourself  0 0 1  Trouble concentrating 1 1 0  Moving slowly or fidgety/restless 1 1 0  Suicidal thoughts 0 0 0  PHQ-9 Score 7 12 10   Difficult doing work/chores Somewhat difficult Somewhat difficult Somewhat difficult       11/10/2023    3:44 PM 09/08/2023    3:21 PM 08/11/2023    9:44 AM  GAD 7 : Generalized Anxiety Score  Nervous, Anxious, on Edge 1 1 0  Control/stop worrying 0 0 1  Worry too much - different things 0 0 0  Trouble relaxing 1 1 1   Restless 1 1 0   Easily annoyed or irritable 1 2 2   Afraid - awful might happen 0 0 0  Total GAD 7 Score 4 5 4   Anxiety Difficulty Somewhat difficult Somewhat difficult Somewhat difficult      Review of Systems  Constitutional:  Negative for chills and fever.  Respiratory:  Negative for shortness of breath.   Cardiovascular:  Negative for chest pain.  Gastrointestinal:  Positive for constipation. Negative for abdominal pain, diarrhea, heartburn, nausea and vomiting.       Bloating  Genitourinary:  Negative for dysuria, frequency and urgency.  Musculoskeletal:  Positive for joint pain.  Neurological:  Negative for dizziness and headaches.  Endo/Heme/Allergies:  Negative for polydipsia.  Psychiatric/Behavioral:  Negative for depression and suicidal ideas. The patient is not nervous/anxious.       Objective:     BP 122/80   Pulse 97   Temp 98.5 F (36.9 C) (Oral)   Ht 5' 5.25 (1.657 m)   Wt 189 lb 3.2 oz (85.8 kg)   SpO2 98%   BMI 31.24 kg/m  BP Readings from Last 3 Encounters:  11/10/23 122/80  09/08/23 116/76  08/11/23 118/80   Wt Readings from Last 3 Encounters:  11/10/23 189 lb 3.2 oz (85.8 kg)  09/08/23 205 lb (93 kg)  08/11/23 208 lb (94.3 kg)      Physical Exam Vitals and nursing note reviewed.  Constitutional:      Appearance: Normal appearance.  Cardiovascular:     Rate and Rhythm: Normal rate and regular rhythm.     Pulses: Normal pulses.     Heart sounds: Normal heart sounds.  Pulmonary:     Effort: Pulmonary effort is normal.     Breath sounds: Normal breath sounds.  Musculoskeletal:     Right wrist: Normal range of motion.     Left wrist: No swelling. Decreased range of motion.  Neurological:     Mental Status: She is alert and oriented to person, place, and time.  Psychiatric:        Mood and Affect: Mood normal.        Behavior: Behavior normal.        Thought Content: Thought content normal.        Judgment: Judgment normal.      No results found  for any visits on 11/10/23.     The 10-year ASCVD risk score (Arnett DK, et al., 2019) is: 2.7%    Assessment & Plan:  Type 2 diabetes mellitus without complication, without long-term current use of insulin (HCC) -     Hemoglobin A1c -     Basic metabolic panel with GFR -     Tirzepatide ; Inject 7.5 mg into the skin once a week.  Dispense: 2 mL; Refill: 1  Hyperlipidemia associated with type 2 diabetes mellitus (HCC) -     Rosuvastatin  Calcium ; Take 1  tablet (5 mg total) by mouth daily.  Dispense: 90 tablet; Refill: 1  Left wrist pain  Psoriatic arthritis (HCC)    Assessment and Plan Assessment & Plan Type 2 diabetes mellitus Managed with Mounjaro . Mild nausea and bloating post-injection. Weight loss plateaued. A1c monitored. Kidney function evaluation needed. - Increase Mounjaro  to 7.5 mg once weekly. - hemoglobin A1c and BMP pending.  - reviewed hepatic function panel from June. - Advise use of probiotics for bloating and gas. - Follow up in three months with fasting for diabetes and cholesterol check.  Mixed hyperlipidemia Managed with Crestor . Last cholesterol panel high. New panel needed. - Continue Crestor . - Order lipid panel at next visit, ensuring fasting.  Psoriatic arthritis and psoriasis Misdiagnosed with rheumatoid arthritis, now diagnosed with psoriatic arthritis. Psoriasis on back of head. Started Tremfya, no noticeable difference yet. Rheumatologist monitoring liver function. - Continue Tremfya. - Monitor for any changes in symptoms.  Left wrist pain - evaluated at emerg. Ortho.  - has been treated with prednisone  pack, cortisone injection in May. She is wearing a brace daily. - would like ortho referral.  - patient will schedule appt with Dr. Watt.   Return in about 3 months (around 02/10/2024) for DM and cholesterol (please come fasting).    Carrol Aurora, NP

## 2023-11-10 NOTE — Patient Instructions (Signed)
 Stop by the lab prior to leaving today. I will notify you of your results once received.   Continue Crestor .   Increase mounjaro  to 7.5 mg once weekly.   Follow up in 3 months.   Schedule appt with Dr. Watt for left wrist pain.   It was a pleasure to see you today!

## 2023-11-11 ENCOUNTER — Ambulatory Visit: Payer: Self-pay | Admitting: General Practice

## 2023-11-11 LAB — BASIC METABOLIC PANEL WITH GFR
BUN: 9 mg/dL (ref 6–23)
CO2: 29 meq/L (ref 19–32)
Calcium: 8.8 mg/dL (ref 8.4–10.5)
Chloride: 105 meq/L (ref 96–112)
Creatinine, Ser: 0.83 mg/dL (ref 0.40–1.20)
GFR: 86.62 mL/min (ref >60.00)
Glucose, Bld: 109 mg/dL — ABNORMAL HIGH (ref 70–99)
Potassium: 5 meq/L (ref 3.5–5.1)
Sodium: 140 meq/L (ref 135–145)

## 2023-11-11 LAB — HEMOGLOBIN A1C: Hgb A1c MFr Bld: 6.3 % (ref 4.6–6.5)

## 2023-11-16 NOTE — Progress Notes (Unsigned)
     Emidio Warrell T. Guenevere Roorda, MD, CAQ Sports Medicine [facility] [address] [place], [address]  Phone: [phone]  FAX: [phone]  Anelise Staron - 43 y.o. female  MRN [id number]  Date of Birth: [birth year]  Date: 11/17/2023  PCP: Vincente Shivers, NP  Referral: Vincente Shivers, NP  No chief complaint on file.  Subjective:   Ikea Demicco is a 43 y.o. very pleasant female patient with There is no height or weight on file to calculate BMI. who presents with the following:  Discussed the use of AI scribe software for clinical note transcription with the patient, who gave verbal consent to proceed.  The patient presents with left wrist pain. History of Present Illness     Review of Systems is noted in the HPI, as appropriate  Objective:   There were no vitals taken for this visit.  GEN: No acute distress; alert,appropriate. PULM: Breathing comfortably in no respiratory distress PSYCH: Normally interactive.    Laboratory and Imaging Data:  Assessment and Plan:   No diagnosis found. Assessment & Plan   Medication Management during today's office visit: No orders of the defined types were placed in this encounter.  There are no discontinued medications.  Orders placed today for conditions managed today: No orders of the defined types were placed in this encounter.   Disposition: No follow-ups on file.  [employer] One speech-to-text software was used for transcription in this dictation.  Possible transcriptional errors can occur using Animal nutritionist.   Signed,  Jacques DASEN. Chauntelle Azpeitia, MD   Outpatient Encounter Medications as of 11/17/2023  Medication Sig   Accu-Chek Softclix Lancets lancets SMARTSIG:Topical   Blood Glucose Monitoring Suppl DEVI 1 each by Does not apply route in the morning, at noon, and at bedtime. May substitute to any manufacturer covered by patient's insurance.   DULoxetine (CYMBALTA) 30 MG capsule  Take 30 mg by mouth daily.   esomeprazole  (NEXIUM ) 20 MG capsule Take 1 capsule (20 mg total) by mouth daily at 12 noon.   gabapentin (NEURONTIN) 300 MG capsule Take 600 mg by mouth 3 (three) times daily.   Glucose Blood (BLOOD GLUCOSE TEST STRIPS) STRP 1 each by In Vitro route in the morning, at noon, and at bedtime. May substitute to any manufacturer covered by patient's insurance.   Multiple Vitamin (MULTIVITAMIN ADULT PO) Take by mouth.   rosuvastatin  (CRESTOR ) 5 MG tablet Take 1 tablet (5 mg total) by mouth daily.   tirzepatide  (MOUNJARO ) 7.5 MG/0.5ML Pen Inject 7.5 mg into the skin once a week.   traMADol (ULTRAM) 50 MG tablet Take by mouth every 6 (six) hours as needed.   TREMFYA PEN 100 MG/ML pen    No facility-administered encounter medications on file as of 11/17/2023.

## 2023-11-17 ENCOUNTER — Ambulatory Visit (INDEPENDENT_AMBULATORY_CARE_PROVIDER_SITE_OTHER): Admitting: Family Medicine

## 2023-11-17 ENCOUNTER — Encounter: Payer: Self-pay | Admitting: Family Medicine

## 2023-11-17 VITALS — BP 118/80 | HR 95 | Temp 98.4°F | Ht 61.0 in | Wt 186.5 lb

## 2023-11-17 DIAGNOSIS — M25532 Pain in left wrist: Secondary | ICD-10-CM | POA: Diagnosis not present

## 2023-11-17 DIAGNOSIS — L4050 Arthropathic psoriasis, unspecified: Secondary | ICD-10-CM | POA: Diagnosis not present

## 2023-11-17 DIAGNOSIS — M654 Radial styloid tenosynovitis [de Quervain]: Secondary | ICD-10-CM

## 2023-11-17 MED ORDER — PREDNISONE 20 MG PO TABS
ORAL_TABLET | ORAL | 0 refills | Status: DC
Start: 2023-11-17 — End: 2023-12-02

## 2023-11-17 NOTE — Patient Instructions (Addendum)
  Tylenol : 2 tablets up to 3-4 times a day Regular NSAIDS are helpful (avoid in kidney disease and ulcers)  Alleve 2 tabs by mouth two times a day over the counter: Take at least for 2 - 3 weeks. This is equal to a prescripton strength dose (GENERIC CHEAPER EQUIVALENT IS NAPROXEN  SODIUM)    Supplements: Tart cherry juice and Curcumin (Turmeric extract) have good scientific evidence  - get the concentrated capsules or gelcaps over the counter so you do not get the calories from the juice.  Voltaren 1% gel, over the counter You can apply up to 4 times a day  This can be applied to any joint: knee, wrist, fingers, elbows, shoulders, feet and ankles. Can apply to any tendon: tennis elbow, achilles, tendon, rotator cuff or any other tendon.  Minimal is absorbed in the bloodstream: ok with oral anti-inflammatory or a blood thinner.  Cost is about 9 dollars

## 2023-12-02 ENCOUNTER — Encounter: Payer: Self-pay | Admitting: Family Medicine

## 2023-12-02 ENCOUNTER — Ambulatory Visit (INDEPENDENT_AMBULATORY_CARE_PROVIDER_SITE_OTHER): Admitting: Family Medicine

## 2023-12-02 ENCOUNTER — Other Ambulatory Visit (HOSPITAL_COMMUNITY)
Admission: RE | Admit: 2023-12-02 | Discharge: 2023-12-02 | Disposition: A | Discharge status: Home / Self Care | Source: Ambulatory Visit | Attending: Family Medicine | Admitting: Family Medicine

## 2023-12-02 VITALS — BP 113/77 | HR 89 | Ht 63.0 in | Wt 189.4 lb

## 2023-12-02 DIAGNOSIS — Z124 Encounter for screening for malignant neoplasm of cervix: Secondary | ICD-10-CM | POA: Insufficient documentation

## 2023-12-02 DIAGNOSIS — Z01419 Encounter for gynecological examination (general) (routine) without abnormal findings: Secondary | ICD-10-CM | POA: Diagnosis not present

## 2023-12-02 DIAGNOSIS — N939 Abnormal uterine and vaginal bleeding, unspecified: Secondary | ICD-10-CM | POA: Diagnosis not present

## 2023-12-02 DIAGNOSIS — L680 Hirsutism: Secondary | ICD-10-CM | POA: Diagnosis not present

## 2023-12-02 NOTE — Assessment & Plan Note (Addendum)
 00795 - Check labs and u/s. After results, consider endometrial biopsy, and or treatment options.

## 2023-12-02 NOTE — Patient Instructions (Signed)

## 2023-12-02 NOTE — Assessment & Plan Note (Addendum)
 00795 - Check labs and pelvic sonogram

## 2023-12-02 NOTE — Progress Notes (Signed)
 Subjective:     Brandi Parrish is a 43 y.o. female and is here for a comprehensive physical exam. The patient reports problems - heavy menstrual bleeding. Has h/o PCOS and elevated testosterone  levels leading to hair growth. Cycles are 7-10 days, quite painful. Heavy first 3 days. Cycles are monthly usually. Previous pt of Dr. Janeen at [facility]. On spironolactone x 6 years with no improvement.  No u/s ever.  The following portions of the patient's history were reviewed and updated as appropriate: allergies, current medications, past family history, past medical history, past social history, past surgical history, and problem list.  Review of Systems Pertinent items noted in HPI and remainder of comprehensive ROS otherwise negative.   Objective:  Chaperone present for exam   BP 113/77 (BP Location: Left Arm, Patient Position: Sitting, Cuff Size: Normal)   Pulse 89   Ht 5' 3 (1.6 m)   Wt 189 lb 6.4 oz (85.9 kg)   LMP 11/09/2023 (Exact Date)   BMI 33.55 kg/m  General appearance: alert, cooperative, appears stated age, and hirsute Head: Normocephalic, without obvious abnormality, atraumatic Neck: no adenopathy, supple, symmetrical, trachea midline, and thyroid  not enlarged, symmetric, no tenderness/mass/nodules Lungs: clear to auscultation bilaterally Breasts: normal appearance, no masses or tenderness Heart: regular rate and rhythm, S1, S2 normal, no murmur, click, rub or gallop Abdomen: soft, non-tender; bowel sounds normal; no masses,  no organomegaly Pelvic: cervix normal in appearance, external genitalia normal, no adnexal masses or tenderness, no cervical motion tenderness, uterus normal size, shape, and consistency, and vagina normal without discharge Extremities: extremities normal, atraumatic, no cyanosis or edema Pulses: 2+ and symmetric Skin: Skin color, texture, turgor normal. No rashes or lesions Lymph nodes: Cervical, supraclavicular, and axillary nodes  normal. Neurologic: Grossly normal    Assessment:    Healthy female exam.      Plan:   Problem List Items Addressed This Visit       Unprioritized   Hirsutism   99204 - Check labs and pelvic sonogram      Relevant Orders   DHEA-sulfate   TestT+TestF+SHBG   Follicle stimulating hormone   LH   Abnormal uterine bleeding   99204 - Check labs and u/s. After results, consider endometrial biopsy, and or treatment options.      Relevant Orders   US  PELVIC COMPLETE WITH TRANSVAGINAL   TSH   Other Visit Diagnoses       Well woman exam with routine gynecological exam    -  Primary   Pap today. Mammogram is scheduled. Declined flu. Has PCP for annual labs.     Screening for malignant neoplasm of cervix       Relevant Orders   Cytology - PAP      Return in 4 weeks (on 12/30/2023) for needs U/S, a follow-up.    See After Visit Summary for Counseling Recommendations

## 2023-12-03 ENCOUNTER — Ambulatory Visit: Payer: Self-pay | Admitting: Family Medicine

## 2023-12-03 LAB — CYTOLOGY - PAP
Comment: NEGATIVE
Diagnosis: NEGATIVE
High risk HPV: NEGATIVE

## 2023-12-03 LAB — LUTEINIZING HORMONE: LH: 16.3 m[IU]/mL

## 2023-12-05 ENCOUNTER — Ambulatory Visit
Admission: RE | Admit: 2023-12-05 | Discharge: 2023-12-05 | Disposition: A | Discharge status: Home / Self Care | Source: Ambulatory Visit | Attending: Family Medicine | Admitting: Family Medicine

## 2023-12-05 DIAGNOSIS — N939 Abnormal uterine and vaginal bleeding, unspecified: Secondary | ICD-10-CM | POA: Diagnosis not present

## 2023-12-05 DIAGNOSIS — D259 Leiomyoma of uterus, unspecified: Secondary | ICD-10-CM | POA: Diagnosis not present

## 2023-12-05 DIAGNOSIS — N83202 Unspecified ovarian cyst, left side: Secondary | ICD-10-CM | POA: Diagnosis not present

## 2023-12-07 LAB — TESTT+TESTF+SHBG
Sex Hormone Binding: 20.1 nmol/L — ABNORMAL LOW (ref 24.6–122.0)
Testosterone, Free: 2.7 pg/mL (ref 0.0–4.2)
Testosterone, Total, LC/MS: 21.5 ng/dL

## 2023-12-07 LAB — TSH: TSH: 0.934 u[IU]/mL (ref 0.450–4.500)

## 2023-12-07 LAB — FOLLICLE STIMULATING HORMONE: FSH: 2.6 m[IU]/mL

## 2023-12-07 LAB — DHEA-SULFATE: DHEA-SO4: 201 ug/dL (ref 57.3–279.2)

## 2023-12-12 ENCOUNTER — Encounter: Payer: Self-pay | Admitting: General Practice

## 2023-12-12 NOTE — Telephone Encounter (Signed)
 LOV - 11/17/23 NOV - 02/10/24 Last refilled-  neither refilled by Carrol yet; okay to refill?

## 2023-12-12 NOTE — Telephone Encounter (Signed)
 Called and spoke with patient about medications. Patient states she has not been called about pain management referral yet which was placed back in May. I pulled up her referrals and looks like per notes in June referral was denied and noted she needs to see rheumatology for RA treatment. Patient has been seeing rheumatology and last saw them in July. Her medications were being filled by her last PCP. She has about a week left of gabapentin but she is out of the tramadol right now. States she takes the tramadol very sparingly and is okay to wait until Carrol is back in the office to address refills.

## 2023-12-13 MED ORDER — GABAPENTIN 300 MG PO CAPS: 600.0000 mg | ORAL_CAPSULE | Freq: Three times a day (TID) | ORAL | 0 refills | Status: AC

## 2023-12-13 MED ORDER — TRAMADOL HCL 50 MG PO TABS: 50.0000 mg | ORAL_TABLET | Freq: Two times a day (BID) | ORAL | 0 refills | Status: AC | PRN

## 2023-12-15 NOTE — Telephone Encounter (Signed)
 Called patient and notified patient about message below. Patient verbalized understanding and will discuss further with rheumatology.

## 2023-12-16 ENCOUNTER — Other Ambulatory Visit: Payer: Self-pay | Admitting: *Deleted

## 2023-12-16 DIAGNOSIS — N83209 Unspecified ovarian cyst, unspecified side: Secondary | ICD-10-CM

## 2023-12-18 ENCOUNTER — Telehealth: Payer: Self-pay

## 2023-12-18 ENCOUNTER — Encounter: Payer: Self-pay | Admitting: General Practice

## 2023-12-18 NOTE — Telephone Encounter (Signed)
 Notified pt of upcoming u/s appt in December.

## 2023-12-25 ENCOUNTER — Ambulatory Visit: Admitting: Occupational Therapy

## 2023-12-29 ENCOUNTER — Ambulatory Visit: Admitting: Occupational Therapy

## 2024-01-02 ENCOUNTER — Ambulatory Visit (INDEPENDENT_AMBULATORY_CARE_PROVIDER_SITE_OTHER): Admitting: Family Medicine

## 2024-01-02 ENCOUNTER — Encounter: Payer: Self-pay | Admitting: Family Medicine

## 2024-01-02 VITALS — BP 121/80 | HR 92 | Wt 182.4 lb

## 2024-01-02 DIAGNOSIS — N939 Abnormal uterine and vaginal bleeding, unspecified: Secondary | ICD-10-CM | POA: Diagnosis not present

## 2024-01-02 DIAGNOSIS — N83202 Unspecified ovarian cyst, left side: Secondary | ICD-10-CM | POA: Diagnosis not present

## 2024-01-02 MED ORDER — NORETHINDRONE ACETATE 5 MG PO TABS: 5.0000 mg | ORAL_TABLET | Freq: Every day | ORAL | 3 refills | Status: DC

## 2024-01-02 NOTE — Assessment & Plan Note (Signed)
 Appears hemorrhagic, 2.8 x 2.0 x 2.6. f/u u/s in 6 weeks.

## 2024-01-02 NOTE — Progress Notes (Signed)
   Subjective:    Patient ID: Brandi Parrish is a 43 y.o. female presenting with Follow-up  on 01/02/2024  HPI: Reports hair loss that is significant. Has started Mounjaro  which may be contributing. Having heavier cycles and u/s shows fibroids.  Review of Systems  Constitutional:  Negative for chills and fever.  Respiratory:  Negative for shortness of breath.   Cardiovascular:  Negative for chest pain.  Gastrointestinal:  Negative for abdominal pain, nausea and vomiting.  Genitourinary:  Negative for dysuria.  Skin:  Negative for rash.      Objective:    BP 121/80   Pulse 92   Wt 182 lb 6.4 oz (82.7 kg)   LMP 12/08/2023 (Exact Date)   BMI 32.31 kg/m  Physical Exam Exam conducted with a chaperone present.  Constitutional:      General: She is not in acute distress.    Appearance: She is well-developed.  HENT:     Head: Normocephalic and atraumatic.  Eyes:     General: No scleral icterus. Cardiovascular:     Rate and Rhythm: Normal rate.  Pulmonary:     Effort: Pulmonary effort is normal.  Abdominal:     Palpations: Abdomen is soft.  Musculoskeletal:     Cervical back: Neck supple.  Skin:    General: Skin is warm and dry.  Neurological:     Mental Status: She is alert and oriented to person, place, and time.         Assessment & Plan:   Problem List Items Addressed This Visit       Unprioritized   Abnormal uterine bleeding - Primary   Secondary to fibroids +/- adenomyosis, has h/o C-section x 2. Discussed po meds, IUD, endometrial ablation, Sonata, UFE, hysteroscopic resection. She will consider options. Reviewed R/B/A of each briefly. Will begin Aygestin daily while she considers.       Relevant Medications   norethindrone (AYGESTIN) 5 MG tablet   Left ovarian cyst   Appears hemorrhagic, 2.8 x 2.0 x 2.6. f/u u/s in 6 weeks.         Return in about 6 weeks (around 02/13/2024).  Glenys GORMAN Birk, MD 01/02/2024 11:30 AM

## 2024-01-02 NOTE — Assessment & Plan Note (Signed)
 Secondary to fibroids +/- adenomyosis, has h/o C-section x 2. Discussed po meds, IUD, endometrial ablation, Sonata, UFE, hysteroscopic resection. She will consider options. Reviewed R/B/A of each briefly. Will begin Aygestin daily while she considers.

## 2024-01-03 NOTE — Progress Notes (Deleted)
     Celester Morgan T. Stephanos Fan, MD, CAQ [facility] [address] [place], [address]  Phone: [phone]  FAX: [phone]  Brandi Parrish - 43 y.o. female  MRN [id number]  Date of Birth: 19-Sep-1980  Date: 01/05/2024  PCP: Vincente Shivers, NP  Referral: Vincente Shivers, NP  No chief complaint on file.  Subjective:   Brandi Parrish is a 43 y.o. very pleasant female patient with There is no height or weight on file to calculate BMI. who presents with the following:  Discussed the use of AI scribe software for clinical note transcription with the patient, who gave verbal consent to proceed.  Patient presents with ongoing wrist pain.  I saw her for some de Quervain's tenosynovitis on November 17, 2023 on the left side.  At that point, I placed her in a thumb spica splint, gave her some oral steroids and referred her for hand therapy. History of Present Illness     Review of Systems is noted in the HPI, as appropriate  Objective:   LMP 12/08/2023 (Exact Date)   GEN: No acute distress; alert,appropriate. PULM: Breathing comfortably in no respiratory distress PSYCH: Normally interactive.   Laboratory and Imaging Data:  Assessment and Plan:   No diagnosis found. Assessment & Plan   Medication Management during today's office visit: No orders of the defined types were placed in this encounter.  There are no discontinued medications.  Orders placed today for conditions managed today: No orders of the defined types were placed in this encounter.   Disposition: No follow-ups on file.  [employer] One speech-to-text software was used for transcription in this dictation.  Possible transcriptional errors can occur using Animal nutritionist.   Signed,  Jacques DASEN. Dominyck Reser, MD   Outpatient Encounter Medications as of 01/05/2024  Medication Sig   Accu-Chek Softclix Lancets lancets SMARTSIG:Topical   Blood Glucose Monitoring  Suppl DEVI 1 each by Does not apply route in the morning, at noon, and at bedtime. May substitute to any manufacturer covered by patient's insurance.   esomeprazole  (NEXIUM ) 20 MG capsule Take 1 capsule (20 mg total) by mouth daily at 12 noon.   gabapentin  (NEURONTIN ) 300 MG capsule Take 2 capsules (600 mg total) by mouth 3 (three) times daily.   Glucose Blood (BLOOD GLUCOSE TEST STRIPS) STRP 1 each by In Vitro route in the morning, at noon, and at bedtime. May substitute to any manufacturer covered by patient's insurance.   Multiple Vitamin (MULTIVITAMIN ADULT PO) Take by mouth.   norethindrone (AYGESTIN) 5 MG tablet Take 1 tablet (5 mg total) by mouth daily.   rosuvastatin  (CRESTOR ) 5 MG tablet Take 1 tablet (5 mg total) by mouth daily.   tirzepatide  (MOUNJARO ) 7.5 MG/0.5ML Pen Inject 7.5 mg into the skin once a week.   traMADol  (ULTRAM ) 50 MG tablet Take 1 tablet (50 mg total) by mouth every 12 (twelve) hours as needed.   TREMFYA PEN 100 MG/ML pen    No facility-administered encounter medications on file as of 01/05/2024.

## 2024-01-05 ENCOUNTER — Ambulatory Visit: Admitting: Family Medicine

## 2024-01-05 ENCOUNTER — Ambulatory Visit: Admitting: General Practice

## 2024-01-05 DIAGNOSIS — M25532 Pain in left wrist: Secondary | ICD-10-CM

## 2024-01-05 DIAGNOSIS — M654 Radial styloid tenosynovitis [de Quervain]: Secondary | ICD-10-CM

## 2024-01-06 ENCOUNTER — Encounter: Admitting: Occupational Therapy

## 2024-01-12 ENCOUNTER — Encounter: Admitting: Occupational Therapy

## 2024-01-19 ENCOUNTER — Encounter: Admitting: Occupational Therapy

## 2024-01-21 ENCOUNTER — Encounter: Admitting: Occupational Therapy

## 2024-01-28 ENCOUNTER — Encounter

## 2024-02-05 ENCOUNTER — Encounter

## 2024-02-09 DIAGNOSIS — Z7960 Long term (current) use of unspecified immunomodulators and immunosuppressants: Secondary | ICD-10-CM | POA: Diagnosis not present

## 2024-02-09 DIAGNOSIS — M797 Fibromyalgia: Secondary | ICD-10-CM | POA: Diagnosis not present

## 2024-02-09 DIAGNOSIS — L4050 Arthropathic psoriasis, unspecified: Secondary | ICD-10-CM | POA: Diagnosis not present

## 2024-02-09 DIAGNOSIS — M47819 Spondylosis without myelopathy or radiculopathy, site unspecified: Secondary | ICD-10-CM | POA: Diagnosis not present

## 2024-02-09 DIAGNOSIS — L409 Psoriasis, unspecified: Secondary | ICD-10-CM | POA: Diagnosis not present

## 2024-02-09 DIAGNOSIS — M3500 Sicca syndrome, unspecified: Secondary | ICD-10-CM | POA: Diagnosis not present

## 2024-02-10 ENCOUNTER — Ambulatory Visit: Admitting: General Practice

## 2024-02-13 ENCOUNTER — Ambulatory Visit: Admitting: General Practice

## 2024-02-13 ENCOUNTER — Encounter: Payer: Self-pay | Admitting: General Practice

## 2024-02-13 VITALS — BP 122/78 | HR 86 | Temp 98.4°F | Ht 63.0 in | Wt 177.0 lb

## 2024-02-13 DIAGNOSIS — L4050 Arthropathic psoriasis, unspecified: Secondary | ICD-10-CM

## 2024-02-13 DIAGNOSIS — E119 Type 2 diabetes mellitus without complications: Secondary | ICD-10-CM | POA: Diagnosis not present

## 2024-02-13 DIAGNOSIS — Z7985 Long-term (current) use of injectable non-insulin antidiabetic drugs: Secondary | ICD-10-CM

## 2024-02-13 DIAGNOSIS — Z1231 Encounter for screening mammogram for malignant neoplasm of breast: Secondary | ICD-10-CM

## 2024-02-13 DIAGNOSIS — E782 Mixed hyperlipidemia: Secondary | ICD-10-CM | POA: Diagnosis not present

## 2024-02-13 MED ORDER — TIRZEPATIDE 10 MG/0.5ML ~~LOC~~ SOAJ: 10.0000 mg | SUBCUTANEOUS | 2 refills | Status: AC

## 2024-02-13 NOTE — Patient Instructions (Addendum)
 Call and schedule mammogram.   Increase mounjaro  10 mg once weekly.  Continue probiotic.   Follow up in 3 months. We will do labs that day. Please come fasting.   It was a pleasure to see you today!

## 2024-02-13 NOTE — Progress Notes (Signed)
 Established Patient Office Visit  Subjective   Patient ID: Brandi Parrish, female    DOB: 12/17/1980  Age: 43 y.o. MRN: [id number]  Chief Complaint  Patient presents with   Diabetes    Patient here today to follow up on DM. Patient taking mounjaro  7.5 mg injections; also needs refilled.     Diabetes Pertinent negatives for hypoglycemia include no dizziness, headaches or nervousness/anxiousness. Pertinent negatives for diabetes include no chest pain and no polydipsia.    Brandi Parrish is a 43 year old female with past medical history   Discussed the use of AI scribe software for clinical note transcription with the patient, who gave verbal consent to proceed.  History of Present Illness Brandi Parrish is a 43 year old female with type 2 diabetes who presents for a follow-up visit.  She is managing her type 2 diabetes with Mounjaro  7.5 mg weekly. Her blood sugar levels are stable, with morning readings around 105 mg/dL. Despite stable glucose levels, she experiences cravings for sugary and fatty foods and has not observed further weight loss beyond an initial 20-pound reduction. Her last A1c in August decreased from 8.2% to 6.3%. she has competed her diabetic eye exam.  She experiences mild queasiness two days post-injection, which is alleviated by probiotics. She has occasional mild diarrhea that resolves within a day and no significant constipation.  She is managing her hyperlipidemia with daily rosuvastatin . Her LDL levels were previously at 126 mg/dL.  She recently started hydroxychloroquine 200 mg twice daily for rheumatoid arthritis, prescribed by her rheumatologist. She had an eye exam in August at [facility] in [place].    Patient Active Problem List   Diagnosis Date Noted   Left ovarian cyst 01/02/2024   Abnormal uterine bleeding 12/02/2023   Left wrist pain 11/10/2023   Psoriatic arthritis (HCC) 11/10/2023   Mixed hyperlipidemia 09/08/2023   Screening  for cervical cancer 09/08/2023   Fibromyalgia 08/11/2023   Type 2 diabetes mellitus without complication, without long-term current use of insulin (HCC) 04/27/2019   Polycystic ovarian syndrome 06/02/2017   Abnormality of hormone 04/12/2017   Acute low back pain with radicular symptoms, duration less than 6 weeks 03/08/2013   Psoriasis 03/08/2013   Routine general medical examination at a health care facility 09/01/2012   Hirsutism 07/18/2012   GERD (gastroesophageal reflux disease) 07/18/2012   Depression 07/18/2012   Migraine 07/18/2012   Past Medical History:  Diagnosis Date   Arthritis    autoimmune rheumatoid arthritis   Depression    Fibromyalgia    GERD (gastroesophageal reflux disease)    History of chicken pox    Migraines    PCOS (polycystic ovarian syndrome)    Past Surgical History:  Procedure Laterality Date   CESAREAN SECTION  2009   CESAREAN SECTION  2012   TUBAL LIGATION  2012   No Known Allergies       02/13/2024    3:19 PM 12/02/2023    9:12 AM 11/10/2023    3:44 PM  Depression screen PHQ 2/9  Decreased Interest 1 1 0  Down, Depressed, Hopeless 0 0 0  PHQ - 2 Score 1 1 0  Altered sleeping 3 2 2   Tired, decreased energy 3 2 2   Change in appetite 2 1 1   Feeling bad or failure about yourself  0 0 0  Trouble concentrating 2 1 1   Moving slowly or fidgety/restless 0 0 1  Suicidal thoughts 0 0 0  PHQ-9 Score 11 7  7   Difficult doing work/chores Very difficult  Somewhat difficult     Data saved with a previous flowsheet row definition       02/13/2024    3:20 PM 12/02/2023    9:12 AM 11/10/2023    3:44 PM 09/08/2023    3:21 PM  GAD 7 : Generalized Anxiety Score  Nervous, Anxious, on Edge 2 1 1 1   Control/stop worrying 0 1 0 0  Worry too much - different things 0 1 0 0  Trouble relaxing 2 2 1 1   Restless 1 1 1 1   Easily annoyed or irritable 2 2 1 2   Afraid - awful might happen 0 1 0 0  Total GAD 7 Score 7 9 4 5   Anxiety Difficulty Somewhat  difficult  Somewhat difficult Somewhat difficult      Review of Systems  Constitutional:  Negative for chills and fever.  Respiratory:  Negative for shortness of breath.   Cardiovascular:  Negative for chest pain.  Gastrointestinal:  Negative for abdominal pain, constipation, diarrhea, heartburn, nausea and vomiting.  Genitourinary:  Negative for dysuria, frequency and urgency.  Neurological:  Negative for dizziness and headaches.  Endo/Heme/Allergies:  Negative for polydipsia.  Psychiatric/Behavioral:  Negative for depression and suicidal ideas. The patient is not nervous/anxious.       Objective:     BP 122/78   Pulse 86   Temp 98.4 F (36.9 C) (Oral)   Ht 5' 3 (1.6 m)   Wt 177 lb (80.3 kg)   SpO2 98%   BMI 31.35 kg/m  BP Readings from Last 3 Encounters:  02/13/24 122/78  01/02/24 121/80  12/02/23 113/77   Wt Readings from Last 3 Encounters:  02/13/24 177 lb (80.3 kg)  01/02/24 182 lb 6.4 oz (82.7 kg)  12/02/23 189 lb 6.4 oz (85.9 kg)      Physical Exam Vitals and nursing note reviewed.  Constitutional:      Appearance: Normal appearance.  Cardiovascular:     Rate and Rhythm: Normal rate and regular rhythm.     Pulses: Normal pulses.     Heart sounds: Normal heart sounds.  Pulmonary:     Effort: Pulmonary effort is normal.     Breath sounds: Normal breath sounds.  Neurological:     Mental Status: She is alert and oriented to person, place, and time.  Psychiatric:        Mood and Affect: Mood normal.        Behavior: Behavior normal.        Thought Content: Thought content normal.        Judgment: Judgment normal.      No results found for any visits on 02/13/24.     The 10-year ASCVD risk score (Arnett DK, et al., 2019) is: 2.8%    Assessment & Plan:  Type 2 diabetes mellitus without complication, without long-term current use of insulin (HCC) -     Tirzepatide ; Inject 10 mg into the skin once a week.  Dispense: 2 mL; Refill: 2  Mixed  hyperlipidemia  Encounter for screening mammogram for malignant neoplasm of breast -     MM 3D DIAGNOSTIC MAMMOGRAM BILATERAL BREAST; Future  Psoriatic arthritis (HCC)   Assessment and Plan Assessment & Plan Type 2 diabetes mellitus Managed with Mounjaro .  Persistent cravings and mild queasiness noted. - Increase Mounjaro  to 10 mg subcutaneous once weekly. - rx sent.  - Scheduled follow-up in three months with labs.  Mixed hyperlipidemia Managed with rosuvastatin .  LDL goal below 70 due to increased cardiovascular risk. - Continue rosuvastatin  as prescribed. - Check cholesterol levels at next visit.  Rheumatoid arthritis Managed with hydroxychloroquine. Emphasized importance of regular eye exams due to potential ocular side effects. - Continue hydroxychloroquine 200 mg twice daily. - Ensure annual diabetic eye exams are maintained.    Return in about 3 months (around 05/15/2024) for DM2 and fasting labs.SABRA Carrol [place], NP

## 2024-02-17 ENCOUNTER — Encounter

## 2024-02-23 ENCOUNTER — Encounter: Admitting: Occupational Therapy

## 2024-02-25 ENCOUNTER — Encounter

## 2024-03-01 ENCOUNTER — Encounter: Admitting: Occupational Therapy

## 2024-03-03 ENCOUNTER — Encounter

## 2024-03-08 ENCOUNTER — Encounter: Admitting: Occupational Therapy

## 2024-03-09 ENCOUNTER — Ambulatory Visit: Admission: RE | Admit: 2024-03-09 | Source: Ambulatory Visit

## 2024-03-09 ENCOUNTER — Other Ambulatory Visit: Payer: Self-pay | Admitting: General Practice

## 2024-03-09 DIAGNOSIS — K219 Gastro-esophageal reflux disease without esophagitis: Secondary | ICD-10-CM

## 2024-03-11 ENCOUNTER — Encounter: Admitting: Occupational Therapy

## 2024-03-15 ENCOUNTER — Encounter: Admitting: Occupational Therapy

## 2024-03-15 ENCOUNTER — Ambulatory Visit
Admission: RE | Admit: 2024-03-15 | Discharge: 2024-03-15 | Disposition: A | Discharge status: Home / Self Care | Source: Ambulatory Visit | Attending: Family Medicine | Admitting: Family Medicine

## 2024-03-15 DIAGNOSIS — N83209 Unspecified ovarian cyst, unspecified side: Secondary | ICD-10-CM | POA: Diagnosis not present

## 2024-03-17 ENCOUNTER — Encounter: Admitting: Occupational Therapy

## 2024-03-22 ENCOUNTER — Encounter: Admitting: Occupational Therapy

## 2024-03-24 ENCOUNTER — Encounter

## 2024-03-31 ENCOUNTER — Ambulatory Visit: Payer: Self-pay | Admitting: Family Medicine
